# Patient Record
Sex: Male | Born: 1955 | Race: White | Hispanic: No | Smoking: Current every day smoker
Health system: Southern US, Community
[De-identification: ages and names within clinical notes are randomized; demographics above are authoritative.]

## PROBLEM LIST (undated history)

## (undated) DIAGNOSIS — D696 Thrombocytopenia, unspecified: Secondary | ICD-10-CM

## (undated) DIAGNOSIS — F0390 Unspecified dementia without behavioral disturbance: Secondary | ICD-10-CM

## (undated) DIAGNOSIS — D649 Anemia, unspecified: Secondary | ICD-10-CM

## (undated) DIAGNOSIS — E559 Vitamin D deficiency, unspecified: Secondary | ICD-10-CM

## (undated) DIAGNOSIS — F419 Anxiety disorder, unspecified: Secondary | ICD-10-CM

## (undated) DIAGNOSIS — K769 Liver disease, unspecified: Secondary | ICD-10-CM

## (undated) DIAGNOSIS — E785 Hyperlipidemia, unspecified: Secondary | ICD-10-CM

## (undated) DIAGNOSIS — K7460 Unspecified cirrhosis of liver: Secondary | ICD-10-CM

## (undated) DIAGNOSIS — H269 Unspecified cataract: Secondary | ICD-10-CM

## (undated) DIAGNOSIS — B1920 Unspecified viral hepatitis C without hepatic coma: Secondary | ICD-10-CM

## (undated) HISTORY — DX: Unspecified cataract: H26.9

## (undated) HISTORY — DX: Unspecified viral hepatitis C without hepatic coma: B19.20

---

## 2004-07-20 ENCOUNTER — Emergency Department (HOSPITAL_COMMUNITY): Admission: EM | Admit: 2004-07-20 | Discharge: 2004-07-20 | Payer: Self-pay | Admitting: Emergency Medicine

## 2005-01-14 ENCOUNTER — Emergency Department (HOSPITAL_COMMUNITY): Admission: EM | Admit: 2005-01-14 | Discharge: 2005-01-14 | Payer: Self-pay | Admitting: Emergency Medicine

## 2005-02-03 ENCOUNTER — Emergency Department (HOSPITAL_COMMUNITY): Admission: EM | Admit: 2005-02-03 | Discharge: 2005-02-03 | Payer: Self-pay | Admitting: *Deleted

## 2016-04-14 HISTORY — PX: HIP FRACTURE SURGERY: SHX118

## 2016-05-07 DIAGNOSIS — S7292XA Unspecified fracture of left femur, initial encounter for closed fracture: Secondary | ICD-10-CM | POA: Insufficient documentation

## 2016-05-16 ENCOUNTER — Encounter (HOSPITAL_COMMUNITY)
Admission: RE | Admit: 2016-05-16 | Discharge: 2016-05-16 | Disposition: A | Discharge status: Home / Self Care | Payer: Medicare Other | Source: Skilled Nursing Facility | Attending: Internal Medicine | Admitting: Internal Medicine

## 2016-05-16 LAB — BASIC METABOLIC PANEL
Anion gap: 5 (ref 5–15)
BUN: 10 mg/dL (ref 6–20)
CHLORIDE: 103 mmol/L (ref 101–111)
CO2: 25 mmol/L (ref 22–32)
Calcium: 8.4 mg/dL — ABNORMAL LOW (ref 8.9–10.3)
Creatinine, Ser: 0.43 mg/dL — ABNORMAL LOW (ref 0.61–1.24)
GFR calc Af Amer: >60 mL/min (ref >60)
GFR calc non Af Amer: >60 mL/min (ref >60)
Glucose, Bld: 84 mg/dL (ref 65–99)
Potassium: 4 mmol/L (ref 3.5–5.1)
Sodium: 133 mmol/L — ABNORMAL LOW (ref 135–145)

## 2016-05-16 LAB — CBC
HEMATOCRIT: 28.1 % — AB (ref 39.0–52.0)
HEMOGLOBIN: 9.3 g/dL — AB (ref 13.0–17.0)
MCH: 31.7 pg (ref 26.0–34.0)
MCHC: 33.1 g/dL (ref 30.0–36.0)
MCV: 95.9 fL (ref 78.0–100.0)
Platelets: 272 10*3/uL (ref 150–400)
RBC: 2.93 MIL/uL — ABNORMAL LOW (ref 4.22–5.81)
RDW: 20.3 % — ABNORMAL HIGH (ref 11.5–15.5)
WBC: 6.4 10*3/uL (ref 4.0–10.5)

## 2016-05-19 ENCOUNTER — Encounter: Payer: Self-pay | Admitting: Internal Medicine

## 2016-05-19 ENCOUNTER — Non-Acute Institutional Stay (SKILLED_NURSING_FACILITY): Payer: Medicare Other | Admitting: Internal Medicine

## 2016-05-19 DIAGNOSIS — F1010 Alcohol abuse, uncomplicated: Secondary | ICD-10-CM | POA: Diagnosis not present

## 2016-05-19 DIAGNOSIS — B182 Chronic viral hepatitis C: Secondary | ICD-10-CM | POA: Diagnosis not present

## 2016-05-19 DIAGNOSIS — S72002S Fracture of unspecified part of neck of left femur, sequela: Secondary | ICD-10-CM

## 2016-05-19 DIAGNOSIS — Z720 Tobacco use: Secondary | ICD-10-CM

## 2016-05-19 DIAGNOSIS — D649 Anemia, unspecified: Secondary | ICD-10-CM | POA: Insufficient documentation

## 2016-05-19 DIAGNOSIS — F1020 Alcohol dependence, uncomplicated: Secondary | ICD-10-CM | POA: Insufficient documentation

## 2016-05-19 DIAGNOSIS — I471 Supraventricular tachycardia: Secondary | ICD-10-CM | POA: Diagnosis not present

## 2016-05-19 DIAGNOSIS — R790 Abnormal level of blood mineral: Secondary | ICD-10-CM | POA: Insufficient documentation

## 2016-05-19 DIAGNOSIS — D500 Iron deficiency anemia secondary to blood loss (chronic): Secondary | ICD-10-CM | POA: Diagnosis not present

## 2016-05-19 DIAGNOSIS — B1920 Unspecified viral hepatitis C without hepatic coma: Secondary | ICD-10-CM | POA: Insufficient documentation

## 2016-05-19 NOTE — Progress Notes (Signed)
Provider:  Veleta Miners Location:   [place] Room Number: 128/P Place of Service:  SNF (31)  PCP: No primary care provider on file. No care team member to display  Extended Emergency Contact Information Primary Emergency Contact: [place] Phone: [phone] Relation: None  Code Status: DNR Goals of Care: Advanced Directive information Advanced Directives 05/19/2016  Does patient have an advance directive? Yes  Type of Advance Directive Out of facility DNR (pink MOST or yellow form)  Does patient want to make changes to advanced directive? No - Patient declined  Copy of advanced directive(s) in chart? Yes      Chief Complaint  Patient presents with  . New Admit To SNF    HPI: Patient is a 60 y.o. male seen today for admission to SNF for rehab. [place] He had Percutaneous Fixation on 08/23.Marland KitchenAfter surgery he went into Hemorrhagic shock and had SVT.He is admitted to SNF for rehab. Right now beside pain in surgical site he is doing well. History reviewed  He has History Of Hepatitis C Also History of Previous Femur fracture when he was in High school   reports that he has been smoking Cigarettes.  He has a 45.00 pack-year smoking history. He has never used smokeless tobacco. He reports that he drinks about 3.6 oz of alcohol per week . He reports that he does not use drugs. Social History   Social History  . Marital status: Divorced    Spouse name: N/A  . Number of children: N/A  . Years of education: N/A   Occupational History  . Not on file.   Social History Main Topics  . Smoking status: Current Every Day Smoker    Packs/day: 1.50    Years: 30.00    Types: Cigarettes  . Smokeless tobacco: Never Used  . Alcohol use 3.6 oz/week    6 Cans of beer per week  . Drug use: No  . Sexual activity: Not on file   Other Topics Concern  . Not on file   Social History  Narrative  . No narrative on file    Functional Status Survey:    Family History  Problem Relation Age of Onset  . Cancer Mother   . Heart disease Father       No Known Allergies    Medication List       Accurate as of 05/19/16  5:05 PM. Always use your most recent med list.          acetaminophen 500 MG tablet Commonly known as:  TYLENOL Take 1,000 mg by mouth every 8 (eight) hours as needed.   ascorbic acid 500 MG tablet Commonly known as:  VITAMIN C Take 500 mg by mouth 2 (two) times daily.   calcium citrate 950 MG tablet Commonly known as:  CALCITRATE - dosed in mg elemental calcium Take 200 mg of elemental calcium by mouth daily.   Cholecalciferol 10000 units Caps Give 1 capsule by mouth daily   enoxaparin 40 MG/0.4ML injection Commonly known as:  LOVENOX Inject 40 mg into the skin daily. Last dose on 06/22/16   ENSURE Take 237 mLs by mouth 3 (three) times daily between meals.   magnesium chloride 64 MG Tbec SR tablet Commonly known as:  SLOW-MAG Take 2 tablets by mouth 2 (two) times daily. STOP DATE 06/13/16   METOPROLOL TARTRATE PO Take 12.5 mg by mouth 2 (two) times daily.  multivitamin tablet Take 1 tablet by mouth daily.   oxycodone 5 MG capsule Commonly known as:  OXY-IR Take 10 mg by mouth every 4 (four) hours as needed for pain. Severe pain   oxycodone 5 MG capsule Commonly known as:  OXY-IR Take 5 mg by mouth every 4 (four) hours as needed for pain. For moderate pain   senna-docusate 8.6-50 MG tablet Commonly known as:  Senokot-S Take 2 tablets by mouth daily. Hold for diarrhea       Review of Systems  Constitutional: Negative.   Respiratory: Negative.   Cardiovascular: Negative.   Gastrointestinal: Negative.   Genitourinary: Negative.     Vitals:   05/19/16 1119  BP: 104/70  Pulse: 88  Resp: (!) 22  Temp: 99 F (37.2 C)  TempSrc: Oral  Weight: 119 lb 9.6 oz (54.3 kg)  Height: 5\' 6"  (1.676 m)   Body mass index is  19.3 kg/m. Physical Exam  Constitutional: He is oriented to person, place, and time. He appears well-developed.  HENT:  Head: Normocephalic.  Eyes: Pupils are equal, round, and reactive to light.  Neck: Neck supple.  Cardiovascular: Normal rate and regular rhythm.   Pulmonary/Chest: Breath sounds normal. No respiratory distress. He has no wheezes. He exhibits no tenderness.  Abdominal: Bowel sounds are normal. There is no tenderness. There is no rebound and no guarding.  Musculoskeletal: Normal range of motion. He exhibits no edema.  Neurological: He is alert and oriented to person, place, and time.    Labs reviewed: Basic Metabolic Panel:  Recent Labs  05/16/16 0500  NA 133*  K 4.0  CL 103  CO2 25  GLUCOSE 84  BUN 10  CREATININE 0.43*  CALCIUM 8.4*   Liver Function Tests:    CBC:  Cardiac Enzymes:  BNP:   Imaging and Procedures obtained prior to SNF admission: No results found.  Assessment/Plan  Hip fracture, left,  Patient Doing well. Contiue Pt. Follow up with Ortho in [place].  Alcohol abuse  Patient went through Alcohol withdrawal in hospital. Stable. Not interested in quitting right now. Chronic hepatitis C  Do not know the status. Need to have gastroenterology appointment on discharge for treatment.  Paroxysmal supraventricular tachycardia (HCC)  It was most likely due to Hemorrhage shock and alcohol withdrawal. Continue Lopressor.  Iron deficiency anemia due to chronic blood loss  Will start him on Iron Last HGB was 9.3 which is improving from 8. Will continue to monitor. He does need Colonoscopy screening as had never done.  Low serum magnesium level Continue Magnesium Supplement. Last Magnesium was 1.5  Tobacco abuse Patient did not want to talk today about cessation.      Family/ staff Communication:   Labs/tests ordered:

## 2016-05-20 ENCOUNTER — Other Ambulatory Visit: Payer: Self-pay | Admitting: *Deleted

## 2016-05-20 MED ORDER — OXYCODONE HCL 5 MG PO CAPS: ORAL_CAPSULE | ORAL | 0 refills | Status: DC

## 2016-05-20 NOTE — Telephone Encounter (Signed)
[facility] #[phone] Fax: [phone]   

## 2016-05-24 ENCOUNTER — Non-Acute Institutional Stay (SKILLED_NURSING_FACILITY): Payer: Medicare Other | Admitting: Internal Medicine

## 2016-05-24 DIAGNOSIS — D500 Iron deficiency anemia secondary to blood loss (chronic): Secondary | ICD-10-CM | POA: Diagnosis not present

## 2016-05-24 DIAGNOSIS — L539 Erythematous condition, unspecified: Secondary | ICD-10-CM

## 2016-05-24 DIAGNOSIS — I471 Supraventricular tachycardia: Secondary | ICD-10-CM | POA: Diagnosis not present

## 2016-05-24 NOTE — Progress Notes (Signed)
This is an acute visit.  Level care skilled.  Facility [facility].  Chief complaint acute visit follow-up surgical site evaluation.  History of present illness.  Patient is a pleasant 60 year old male who is here for rehabilitation after sustaining a proximal femur fracture after an accident [place] water sports.  He had a percutaneous fixation on August 23 after surgery went into hemorrhagic shock and had SVT.  He's been admitted here for rehabilitation and his course has been fairly unremarkable.  He is not complaining of any palpitations shortness breath or increased weakness beyond baseline.  He still has his staples in and nursing staff has asked me to look at the surgical site apparently he has some mild erythema around the area.  He does report some tenderness to the area but he says this is not new he has been afebrile vital signs appear to be stable pulses lately appear to be in the 70 to 80s range blood pressure appears stable as well he does not complain of any chest pain  Previous medical history.  History of closed fracture of left femur status post repair.  Alcohol dependence.  Hepatitis C.  Tobacco abuse.  Postoperative hemorrhagic shock.  Hypomagnesemia   Social History  . Marital status: Divorced    Spouse name: N/A  . Number of children: N/A  . Years of education: N/A      Occupational History  . Not on file.        Social History Main Topics  . Smoking status: Current Every Day Smoker    Packs/day: 1.50    Years: 30.00    Types: Cigarettes  . Smokeless tobacco: Never Used  . Alcohol use 3.6 oz/week    6 Cans of beer per week  . Drug use: No  . Sexual activity: Not on file       Other Topics Concern  . Not on file      Social History Narrative  . No narrative on file    Functional Status Survey:         Family History  Problem Relation Age of Onset  . Cancer Mother   . Heart disease Father        No Known Allergies        Medication List               acetaminophen 500 MG tablet Commonly known as:  TYLENOL Take 1,000 mg by mouth every 8 (eight) hours as needed.  ascorbic acid 500 MG tablet Commonly known as:  VITAMIN C Take 500 mg by mouth 2 (two) times daily.  calcium citrate 950 MG tablet Commonly known as:  CALCITRATE - dosed in mg elemental calcium Take 200 mg of elemental calcium by mouth daily.  Cholecalciferol 10000 units Caps Give 1 capsule by mouth daily  enoxaparin 40 MG/0.4ML injection Commonly known as:  LOVENOX Inject 40 mg into the skin daily. Last dose on 06/22/16  ENSURE Take 237 mLs by mouth 3 (three) times daily between meals.  magnesium chloride 64 MG Tbec SR tablet Commonly known as:  SLOW-MAG Take 2 tablets by mouth 2 (two) times daily. STOP DATE 06/13/16  METOPROLOL TARTRATE PO Take 12.5 mg by mouth 2 (two) times daily.  multivitamin tablet Take 1 tablet by mouth daily.  oxycodone 5 MG capsule Commonly known as:  OXY-IR Take 10 mg by mouth every 4 (four) hours as needed for pain. Severe pain  oxycodone 5 MG capsule Commonly known as:  OXY-IR Take 5 mg by mouth every 4 (four) hours as needed for pain. For moderate pain  senna-docusate 8.6-50 MG tablet Commonly known as:  Senokot-S Take 2 tablets by mouth daily. Hold for diarrhea      Review of Systems  Constitutional: Negative.  No complaints of fever chills Respiratory: Negative.   Cardiovascular: Negative. No chest pain or palpitations or significant edema   Gastrointestinal: Negative.   Genitourinary: Negative.   Skin-skin issues as noted above   Temperature 98.7 pulse 73 respirations 20 blood pressure 100/67 Body mass index is 19.3 kg/m. Physical Exam  Constitutional: He is oriented to person, place, and time. He appears well-developed.  HENT:  Head: Normocephalic.  Eyes: Pupils are equal, round, and reactive to light.  Neck: Neck supple.   Cardiovascular: Normal rate and regular rhythm.   Pulmonary/Chest: Breath sounds normal. No respiratory distress. He has no wheezes. He exhibits no tenderness.  Abdominal: Bowel sounds are normal. There is no tenderness. There is no rebound and no guarding.  Musculoskeletal: Normal range of motion. He exhibits no edema.Pedal pulses intact.  Left lateral surgical site Staples are in place there is some mild erythema extending around the mid incision area bilaterally there is some tenderness to palpation patient states tenderness has been there since the surgery I do not see any drainage or bleeding-staples left knee area appears to be intact I do not see any erythema drainage or bleeding  Neurological: He is alert and oriented to person, place, and time.    Labs reviewed:  05/16/2016 WBC 6.4 hemoglobin 9.3 platelets [id number] Basic Metabolic Panel:  Recent Labs (within last 365 days)   Recent Labs  05/16/16 0500  NA 133*  K 4.0  CL 103  CO2 25  GLUCOSE 84  BUN 10  CREATININE 0.43*  CALCIUM 8.4*     Liver Function Tests:    CBC:  Cardiac Enzymes:  BNP:   Imaging and Procedures obtained prior to SNF admission: No results found.  Assessment/Plan  Hip fracture, left,--with some erythema  He is followed by orthopedics in [place] will have them contact about their desires for staple removal-he does have some mild erythema around the site will start him empirically on doxycycline 100 mg twice a day for 7 days and monitor also probiotic twice a day for 7 days--also notify orthopedics tomorrow a.m. about these changes in their recommendations  Alcohol abuse  Patient went through Alcohol withdrawal in hospital. Stable. Not interested in quitting right now. Chronic hepatitis C  Do not know the status. Need to have gastroenterology appointment on discharge for treatment.  Paroxysmal supraventricular tachycardia (HCC)  It was most likely due to  Hemorrhage shock and alcohol withdrawal. Continue Lopressor.As noted above pulse rate appears controlled  Iron deficiency anemia due to chronic blood loss   Last hemoglobin was 9.3 on 11/14/2015 he has been started on iron. He does need Colonoscopy screening as had never done. Will update a CBC next lab day  [phone]

## 2016-05-25 ENCOUNTER — Other Ambulatory Visit (HOSPITAL_COMMUNITY)
Admission: RE | Admit: 2016-05-25 | Discharge: 2016-05-25 | Disposition: A | Discharge status: Home / Self Care | Payer: Medicare Other | Source: Skilled Nursing Facility | Attending: Internal Medicine | Admitting: Internal Medicine

## 2016-05-25 DIAGNOSIS — X58XXXA Exposure to other specified factors, initial encounter: Secondary | ICD-10-CM | POA: Diagnosis not present

## 2016-05-25 DIAGNOSIS — S72002A Fracture of unspecified part of neck of left femur, initial encounter for closed fracture: Secondary | ICD-10-CM | POA: Diagnosis present

## 2016-05-25 LAB — CBC
HCT: 34.4 % — ABNORMAL LOW (ref 39.0–52.0)
Hemoglobin: 11.2 g/dL — ABNORMAL LOW (ref 13.0–17.0)
MCH: 32.1 pg (ref 26.0–34.0)
MCHC: 32.6 g/dL (ref 30.0–36.0)
MCV: 98.6 fL (ref 78.0–100.0)
Platelets: 288 10*3/uL (ref 150–400)
RBC: 3.49 MIL/uL — AB (ref 4.22–5.81)
RDW: 18.4 % — ABNORMAL HIGH (ref 11.5–15.5)
WBC: 4.5 10*3/uL (ref 4.0–10.5)

## 2016-05-27 ENCOUNTER — Inpatient Hospital Stay
Admission: RE | Admit: 2016-05-27 | Discharge: 2016-06-25 | Disposition: A | Discharge status: Home / Self Care | Payer: Medicare Other | Source: Ambulatory Visit | Attending: Internal Medicine | Admitting: Internal Medicine

## 2016-06-01 DIAGNOSIS — Z9181 History of falling: Secondary | ICD-10-CM | POA: Insufficient documentation

## 2016-06-01 DIAGNOSIS — E559 Vitamin D deficiency, unspecified: Secondary | ICD-10-CM | POA: Insufficient documentation

## 2016-06-01 DIAGNOSIS — F17200 Nicotine dependence, unspecified, uncomplicated: Secondary | ICD-10-CM | POA: Insufficient documentation

## 2016-06-01 DIAGNOSIS — M810 Age-related osteoporosis without current pathological fracture: Secondary | ICD-10-CM | POA: Insufficient documentation

## 2016-06-05 ENCOUNTER — Encounter: Payer: Self-pay | Admitting: Internal Medicine

## 2016-06-22 ENCOUNTER — Non-Acute Institutional Stay (SKILLED_NURSING_FACILITY): Payer: Medicare Other | Admitting: Internal Medicine

## 2016-06-22 ENCOUNTER — Encounter: Payer: Self-pay | Admitting: Internal Medicine

## 2016-06-22 DIAGNOSIS — I471 Supraventricular tachycardia: Secondary | ICD-10-CM

## 2016-06-22 DIAGNOSIS — D62 Acute posthemorrhagic anemia: Secondary | ICD-10-CM | POA: Diagnosis not present

## 2016-06-22 DIAGNOSIS — S72002D Fracture of unspecified part of neck of left femur, subsequent encounter for closed fracture with routine healing: Secondary | ICD-10-CM

## 2016-06-22 NOTE — Progress Notes (Signed)
Location:   [place] Room Number: 128/P Place of Service:  SNF (31)  Provider: Granville Lewis  PCP: Pcp Not In System Patient Care Team: Pcp Not In System as PCP - General  Extended Emergency Contact Information Primary Emergency Contact: [place] Phone: [phone] Relation: None  Code Status: DNR Goals of care:  Advanced Directive information Advanced Directives 06/22/2016  Does patient have an advance directive? Yes  Type of Advance Directive Out of facility DNR (pink MOST or yellow form)  Does patient want to make changes to advanced directive? No - Patient declined  Copy of advanced directive(s) in chart? Yes     No Known Allergies  Chief Complaint  Patient presents with  . Discharge Note    HPI:  60 y.o. male  who is being discharged from the facility later this week.  He had a proximal femur fracture after her next in the [facility] plain water sports-he had a percutaneous fixation on 05/06/2016 postop he did go into hemorrhagic shock and had SVT.  He has been admitted to skilled nursing for rehabilitation and has done quite well-vital signs are stable he has no complaints today is looking forward to going home he does live alone-he will need PT and OT for further strengthening as well as nursing support to follow his medical issues.  Of note he does have a significant tobacco history but has not smoked apparently since his hospitalization and says he hopes to quit permanently.  He also has drunk apparently about 3.6 ounces of alcohol per week he has not drank alcohol during this hospitalization or his stay here he says he would hope the stay off alcohol as well.  He does not report any illicit drug use.   Past Medical History:  Diagnosis Date  . Hepatitis C     History reviewed. No pertinent surgical history.    reports that he has been smoking Cigarettes.  He has a 45.00 pack-year smoking history. He has never used smokeless  tobacco. He reports that he drinks about 3.6 oz of alcohol per week . He reports that he does not use drugs. Social History   Social History  . Marital status: Divorced    Spouse name: N/A  . Number of children: N/A  . Years of education: N/A   Occupational History  . Not on file.   Social History Main Topics  . Smoking status: Current Every Day Smoker    Packs/day: 1.50    Years: 30.00    Types: Cigarettes  . Smokeless tobacco: Never Used  . Alcohol use 3.6 oz/week    6 Cans of beer per week  . Drug use: No  . Sexual activity: Not on file   Other Topics Concern  . Not on file   Social History Narrative  . No narrative on file   Functional Status Survey:    No Known Allergies  Pertinent  Health Maintenance Due  Topic Date Due  . COLONOSCOPY  07/09/2007  . INFLUENZA VACCINE  08/14/2016 (Originally 04/14/2016)    Medications: Current Outpatient Prescriptions on File Prior to Visit  Medication Sig Dispense Refill  . acetaminophen (TYLENOL) 500 MG tablet Take 1,000 mg by mouth every 8 (eight) hours as needed.    Marland Kitchen ascorbic acid (VITAMIN C) 500 MG tablet Take 500 mg by mouth 2 (two) times daily.    . calcium citrate (CALCITRATE - DOSED IN MG ELEMENTAL CALCIUM) 950 MG tablet Take 200 mg of elemental calcium  by mouth daily.    . Cholecalciferol 10000 units CAPS Give 1 capsule by mouth daily    . ENSURE (ENSURE) Take 237 mLs by mouth 3 (three) times daily between meals.    Marland Kitchen METOPROLOL TARTRATE PO Take 12.5 mg by mouth 2 (two) times daily.    . Multiple Vitamin (MULTIVITAMIN) tablet Take 1 tablet by mouth daily.    Marland Kitchen oxycodone (OXY-IR) 5 MG capsule Take 5 mg by mouth every 4 (four) hours as needed for pain. For moderate pain    . senna-docusate (SENOKOT-S) 8.6-50 MG tablet Take 2 tablets by mouth 2 (two) times daily. Hold for diarrhea      No current facility-administered medications on file prior to visit.      Review of Systems   General does not complaining fever  chills  \Skin does not quite a rashes or itching surgical site left hip appears benign earlier stay had been concern with some erythema but this appears to have resolved.  Head ears eyes nose mouth and throat does not complain of visual changes or sore throat.  Respiratory no shortness breath or cough.  Cardiac does not complain of chest pain does not have significant lower extremity edema.  GI is not complaining of abdominal pain nausea vomiting diarrhea or constipation does have a history of hepatitis.  GU does not complaining of dysuria.  Muscle skeletal is not complaining currently of joint pain apparently hip pain is controlled with the OxyIR which it appears she takes about once a day  Neurologic is not complaining of dizziness headache or syncope.  Psych does not complain of anxiety or depression does have a history of alcohol or tobacco use but says he has not used either here in several weeks and would like to continue abstinence   Vitals:   06/22/16 1220  BP: 107/65  Pulse: 67  Resp: 18  Temp: 97.2 F (36.2 C)  TempSrc: Oral   There is no height or weight on file to calculate BMI. Physical Exam   In general this is a pleasant middle-aged male in no distress resting comfortably in bed.  His skin is warm and dry surgical site has a well-healed surgical scar there is no surrounding erythema or warmth there is some mild tenderness to palpation of the area he says his baseline.  Eyes pupils appear reactive light sclera and conjunctiva are clear visual acuity appears intact   oropharynx is clear mucous membranes moist  Chest is clear to auscultation there is no labored breathing.  Heart is regular rate and rhythm without murmur gallop or rub he does not have significant lower extremity edema pedal pulses are intact.  Abdomen soft nontender positive bowel sounds.  Muscle skeletal is able to move all extremities 4 is able to ambulate with a walker and appears to be  doing well with this pill a bit weak.  Upper extremity strength appears preserved I do not see any deformities.  Neurologic is grossly intact to speech is clear no R findings.  Psych he is alert and oriented pleasant and appropriate  Labs reviewed: Basic Metabolic Panel:  Recent Labs  05/16/16 0500  NA 133*  K 4.0  CL 103  CO2 25  GLUCOSE 84  BUN 10  CREATININE 0.43*  CALCIUM 8.4*   Liver Function Tests: No results for input(s): AST, ALT, ALKPHOS, BILITOT, PROT, ALBUMIN in the last 8760 hours. No results for input(s): LIPASE, AMYLASE in the last 8760 hours. No results for input(s): AMMONIA  in the last 8760 hours. CBC:  Recent Labs  05/16/16 0500 05/25/16 0650  WBC 6.4 4.5  HGB 9.3* 11.2*  HCT 28.1* 34.4*  MCV 95.9 98.6  PLT 272 288   Cardiac Enzymes: No results for input(s): CKTOTAL, CKMB, CKMBINDEX, TROPONINI in the last 8760 hours. BNP: Invalid input(s): POCBNP CBG: No results for input(s): GLUCAP in the last 8760 hours.  Procedures and Imaging Studies During Stay: No results found.  Assessment/Plan:   #1 history of left hip fracture-he appears to be doing well with his rehabilitation would benefit from continued PT and OT when he goes home he did see orthopedics on 06/18/2016 and Lovenox was discontinued for DVT prophylaxis-she is now toe-touch weightbearing on the left lower extremity.  He is requiring OxyIR about 1 time a day.  Currently has no complaints-.  #2 history of anemia most likely postop he has been started on iron and hemoglobin is rising most recently 11.2 on lab done on September 11 previously had been 9.3-and it had been as low as 8-will defer follow-up to primary care provider he is doing well in this regard.  #3 history of supraventricular tachycardia-it was thought likely due to hemorrhagic shock and alcohol withdrawal-this has not been an issue here he is on Lopressor heart rate appears to be controlled largely it appears in the 60-70  range.  #4 history of alcohol abuse apparently he did have alcohol withdrawal the hospital this has not been an issue during stay again as noted he says he will try to stay off alcohol once he is discharged feels he is motivated now.  #5 history of chronic hepatitis C we have suggested GI follow-up he says he is not interested in having this arranged upon discharge-he will see his primary care provider.  #6 history of low magnesium level this has been supplemented last magnesium was 1.5 Will update level.  #7 history of tobacco abuse-as noted above patient is not smoking several weeks says he does not have a desire to smoke again although I suspect this may be somewhat of a challenge once he goes home but he does understand the value of smoking sensation and he was encouraged to continue this      Patient is being discharged with the following home health services: PT and OT for strengthening as well as nursing to follow up his medical issues   :    Patient has been advised to f/u with their PCP in 1-2 weeks to bring them up to date on their rehab stay.  Social services at facility was responsible for arranging this appointment.  Pt was provided with a 30 day supply of prescriptions for medications and refills must be obtained from their PCP.  For controlled substances, a more limited supply may be provided adequate until PCP appointment only.  Future labs/tests needed:  Will update magnesium level as well as CBC and metabolic panel.  [id number] note greater than 30 minutes spent on this discharge summary-greater than 50% of time spent coordinating plan of care for numerous diagnoses

## 2016-06-23 ENCOUNTER — Encounter (HOSPITAL_COMMUNITY)
Admission: RE | Admit: 2016-06-23 | Discharge: 2016-06-23 | Disposition: A | Discharge status: Home / Self Care | Payer: Medicare Other | Source: Skilled Nursing Facility | Attending: Internal Medicine | Admitting: Internal Medicine

## 2016-06-23 LAB — CBC
HCT: 41.2 % (ref 39.0–52.0)
Hemoglobin: 14.2 g/dL (ref 13.0–17.0)
MCH: 33.6 pg (ref 26.0–34.0)
MCHC: 34.5 g/dL (ref 30.0–36.0)
MCV: 97.6 fL (ref 78.0–100.0)
Platelets: 213 10*3/uL (ref 150–400)
RBC: 4.22 MIL/uL (ref 4.22–5.81)
RDW: 13.7 % (ref 11.5–15.5)
WBC: 5 10*3/uL (ref 4.0–10.5)

## 2016-06-23 LAB — BASIC METABOLIC PANEL
Anion gap: 4 — ABNORMAL LOW (ref 5–15)
BUN: 8 mg/dL (ref 6–20)
CALCIUM: 8.9 mg/dL (ref 8.9–10.3)
CO2: 28 mmol/L (ref 22–32)
Chloride: 101 mmol/L (ref 101–111)
Creatinine, Ser: 0.52 mg/dL — ABNORMAL LOW (ref 0.61–1.24)
GFR calc Af Amer: >60 mL/min (ref >60)
GFR calc non Af Amer: >60 mL/min (ref >60)
Glucose, Bld: 76 mg/dL (ref 65–99)
Potassium: 3.4 mmol/L — ABNORMAL LOW (ref 3.5–5.1)
Sodium: 133 mmol/L — ABNORMAL LOW (ref 135–145)

## 2016-06-23 LAB — MAGNESIUM: Magnesium: 1.2 mg/dL — ABNORMAL LOW (ref 1.7–2.4)

## 2016-06-30 ENCOUNTER — Encounter (HOSPITAL_COMMUNITY)
Admission: RE | Admit: 2016-06-30 | Discharge: 2016-06-30 | Disposition: A | Discharge status: Home / Self Care | Payer: Medicare Other | Source: Skilled Nursing Facility | Attending: *Deleted | Admitting: *Deleted

## 2016-07-15 ENCOUNTER — Encounter: Payer: Self-pay | Admitting: Family Medicine

## 2016-07-15 ENCOUNTER — Ambulatory Visit (INDEPENDENT_AMBULATORY_CARE_PROVIDER_SITE_OTHER): Payer: Medicare Other | Admitting: Family Medicine

## 2016-07-15 VITALS — BP 131/75 | HR 68 | Temp 96.9°F | Ht 66.0 in | Wt 121.0 lb

## 2016-07-15 DIAGNOSIS — I471 Supraventricular tachycardia: Secondary | ICD-10-CM

## 2016-07-15 DIAGNOSIS — D508 Other iron deficiency anemias: Secondary | ICD-10-CM | POA: Diagnosis not present

## 2016-07-15 MED ORDER — METOPROLOL TARTRATE 25 MG PO TABS: 12.5000 mg | ORAL_TABLET | Freq: Two times a day (BID) | ORAL | 1 refills | Status: DC

## 2016-07-15 NOTE — Progress Notes (Signed)
Subjective:    Patient ID: Kyle Hammond, male    DOB: Apr 10, 1956, 59 y.o.   MRN: [id number]  HPI first visit for this 60 year old male who suffered a femur fracture in late August. This was repaired and he was discharged to skilled nursing for rehabilitation. There've been issues with alcohol use and abuse over time as he is known by some folks working in this office. He lives by himself but he has a sick brother and sister-in-law who will manage his medicines. He is on several vitamins. He is off pain pills now. He takes metoprolol twice a day for hypertension and supraventricular tachycardia. There is a history of hepatitis C. He complains today of irregular bowel habits and. Reviewing his medicines he is on several laxatives as well as arm which could be affecting his bowel movements.  Patient Active Problem List   Diagnosis Date Noted  . Alcohol abuse 05/19/2016  . Hepatitis C 05/19/2016  . Paroxysmal supraventricular tachycardia ([place]) 05/19/2016  . Anemia 05/19/2016  . Low serum magnesium level 05/19/2016   Outpatient Encounter Prescriptions as of 07/15/2016  Medication Sig  . acetaminophen (TYLENOL) 500 MG tablet Take 1,000 mg by mouth every 8 (eight) hours as needed.  Marland Kitchen ascorbic acid (VITAMIN C) 500 MG tablet Take 500 mg by mouth 2 (two) times daily.  Marland Kitchen aspirin EC 81 MG tablet Take 81 mg by mouth 2 (two) times daily.  . bisacodyl (DULCOLAX) 5 MG EC tablet Take 10 mg by mouth daily as needed for moderate constipation.  . calcium citrate (CALCITRATE - DOSED IN MG ELEMENTAL CALCIUM) 950 MG tablet Take 200 mg of elemental calcium by mouth daily.  . Cholecalciferol 10000 units CAPS Give 1 capsule by mouth daily  . ENSURE (ENSURE) Take 237 mLs by mouth 3 (three) times daily between meals.  . ferrous sulfate (KP FERROUS SULFATE) 325 (65 FE) MG tablet Take 325 mg by mouth 2 (two) times daily with a meal.  . Magnesium 400 MG TABS Take 1 tablet by mouth daily.  . metoprolol tartrate  (LOPRESSOR) 25 MG tablet Take 12.5 tablets by mouth 2 (two) times daily.  . Multiple Vitamin (MULTIVITAMIN) tablet Take 1 tablet by mouth daily.  Marland Kitchen senna-docusate (SENOKOT-S) 8.6-50 MG tablet Take 2 tablets by mouth 2 (two) times daily. Hold for diarrhea   . Vitamin D, Ergocalciferol, (DRISDOL) 50000 units CAPS capsule Give 50,000 unit capsule weekly X 8 weeks once a day on wed. Stop date 07/22/2016  . [DISCONTINUED] METOPROLOL TARTRATE PO Take 12.5 mg by mouth 2 (two) times daily.  . [DISCONTINUED] oxycodone (OXY-IR) 5 MG capsule Take 5 mg by mouth every 4 (four) hours as needed for pain. For moderate pain  . [DISCONTINUED] oxycodone (OXY-IR) 5 MG capsule Give 2 tablets by mouth as needed for severe pain. Every 4 hours   No facility-administered encounter medications on file as of 07/15/2016.       Review of Systems  Constitutional: Negative.   HENT: Negative.   Respiratory: Negative.   Cardiovascular: Negative.   Gastrointestinal: Positive for constipation and rectal pain.  Musculoskeletal: Negative.   Neurological: Negative.   Psychiatric/Behavioral: Negative.        Objective:   Physical Exam  Constitutional: He is oriented to person, place, and time. He appears well-developed.  Patient is slight stature walks with a cane  HENT:  Mouth/Throat: Oropharynx is clear and moist.  Eyes: Pupils are equal, round, and reactive to light.  Cardiovascular: Normal rate, regular rhythm  and normal heart sounds.   Pulmonary/Chest: Effort normal and breath sounds normal.  Abdominal: Soft. Bowel sounds are normal. There is no tenderness.  Genitourinary: Rectum normal.  Neurological: He is alert and oriented to person, place, and time.  Psychiatric: He has a normal mood and affect. His behavior is normal.   BP 131/75   Pulse 68   Temp (!) 96.9 F (36.1 C) (Oral)   Ht '5\' 6"'  (1.676 m)   Wt 121 lb (54.9 kg)   BMI 19.53 kg/m         Assessment & Plan:  1. Other iron deficiency  anemia We'll check CBC today. I've asked him to hold aren't in view of his intestinal complaints. I've asked him to discontinue most of the vitamins but just take a single multivitamin continue with metoprolol. Add MiraLAX to regulate either loose stools or constipation - CBC with Differential/Platelet - BMP8+EGFR - VITAMIN D 25 Hydroxy (Vit-D Deficiency, Fractures)  2. Paroxysmal supraventricular tachycardia (HCC) Into new with metoprolol. Blood pressure is within normal limits. Wardell Honour MD

## 2016-07-16 LAB — BMP8+EGFR
BUN/Creatinine Ratio: 8 — ABNORMAL LOW (ref 9–20)
BUN: 5 mg/dL — AB (ref 6–24)
CALCIUM: 9.1 mg/dL (ref 8.7–10.2)
CO2: 25 mmol/L (ref 18–29)
CREATININE: 0.62 mg/dL — AB (ref 0.76–1.27)
Chloride: 99 mmol/L (ref 96–106)
GFR calc Af Amer: 126 mL/min/{1.73_m2} (ref >59)
GFR calc non Af Amer: 109 mL/min/{1.73_m2} (ref >59)
GLUCOSE: 68 mg/dL (ref 65–99)
Potassium: 4.3 mmol/L (ref 3.5–5.2)
Sodium: 138 mmol/L (ref 134–144)

## 2016-07-16 LAB — CBC WITH DIFFERENTIAL/PLATELET
BASOS ABS: 0 10*3/uL (ref 0.0–0.2)
Basos: 1 %
EOS (ABSOLUTE): 0.1 10*3/uL (ref 0.0–0.4)
Eos: 2 %
HEMOGLOBIN: 15 g/dL (ref 12.6–17.7)
Hematocrit: 43.3 % (ref 37.5–51.0)
IMMATURE GRANS (ABS): 0 10*3/uL (ref 0.0–0.1)
IMMATURE GRANULOCYTES: 0 %
LYMPHS: 46 %
Lymphocytes Absolute: 3 10*3/uL (ref 0.7–3.1)
MCH: 32.9 pg (ref 26.6–33.0)
MCHC: 34.6 g/dL (ref 31.5–35.7)
MCV: 95 fL (ref 79–97)
MONOCYTES: 7 %
Monocytes Absolute: 0.5 10*3/uL (ref 0.1–0.9)
NEUTROS ABS: 2.9 10*3/uL (ref 1.4–7.0)
Neutrophils: 44 %
Platelets: 209 10*3/uL (ref 150–379)
RBC: 4.56 x10E6/uL (ref 4.14–5.80)
RDW: 12.9 % (ref 12.3–15.4)
WBC: 6.5 10*3/uL (ref 3.4–10.8)

## 2016-07-16 LAB — VITAMIN D 25 HYDROXY (VIT D DEFICIENCY, FRACTURES): VIT D 25 HYDROXY: 58.1 ng/mL (ref 30.0–100.0)

## 2016-07-17 ENCOUNTER — Telehealth: Payer: Self-pay | Admitting: Family Medicine

## 2016-07-17 NOTE — Telephone Encounter (Signed)
[facility] says it is there, just to early to fill due to insurance. Pt aware

## 2016-08-20 ENCOUNTER — Ambulatory Visit: Payer: Medicare Other | Admitting: Family Medicine

## 2016-08-31 ENCOUNTER — Telehealth: Payer: Self-pay | Admitting: Family Medicine

## 2016-08-31 DIAGNOSIS — M25552 Pain in left hip: Secondary | ICD-10-CM

## 2016-08-31 NOTE — Telephone Encounter (Signed)
Pt aware referral has been made & Forestine Na will give him a call with the appointment

## 2016-08-31 NOTE — Telephone Encounter (Signed)
Please schedule for physical therapy as requested by patient

## 2018-08-01 ENCOUNTER — Telehealth: Payer: Self-pay

## 2018-08-01 NOTE — Telephone Encounter (Signed)
appt scheduled for pt.  

## 2018-08-01 NOTE — Telephone Encounter (Signed)
Patient calling for advise  Hasn't been her since 2017

## 2018-08-08 ENCOUNTER — Encounter: Payer: Medicare Other | Admitting: Family

## 2019-02-03 ENCOUNTER — Telehealth: Payer: Self-pay | Admitting: Family

## 2019-07-31 ENCOUNTER — Other Ambulatory Visit: Payer: Self-pay | Admitting: Gastroenterology

## 2019-07-31 ENCOUNTER — Other Ambulatory Visit (HOSPITAL_COMMUNITY): Payer: Self-pay | Admitting: Gastroenterology

## 2019-07-31 DIAGNOSIS — Z8619 Personal history of other infectious and parasitic diseases: Secondary | ICD-10-CM

## 2019-08-08 ENCOUNTER — Ambulatory Visit (HOSPITAL_COMMUNITY)
Admission: RE | Admit: 2019-08-08 | Discharge: 2019-08-08 | Disposition: A | Discharge status: Home / Self Care | Payer: Medicare Other | Source: Ambulatory Visit | Attending: Gastroenterology | Admitting: Gastroenterology

## 2019-08-08 ENCOUNTER — Other Ambulatory Visit: Payer: Self-pay

## 2019-08-08 ENCOUNTER — Encounter (HOSPITAL_COMMUNITY): Payer: Self-pay

## 2019-08-08 DIAGNOSIS — Z8619 Personal history of other infectious and parasitic diseases: Secondary | ICD-10-CM

## 2019-08-15 ENCOUNTER — Ambulatory Visit (HOSPITAL_COMMUNITY)
Admission: RE | Admit: 2019-08-15 | Discharge: 2019-08-15 | Disposition: A | Discharge status: Home / Self Care | Payer: Medicare Other | Source: Ambulatory Visit | Attending: Gastroenterology | Admitting: Gastroenterology

## 2019-08-15 ENCOUNTER — Other Ambulatory Visit: Payer: Self-pay

## 2019-08-15 DIAGNOSIS — Z8619 Personal history of other infectious and parasitic diseases: Secondary | ICD-10-CM | POA: Diagnosis not present

## 2019-12-21 ENCOUNTER — Ambulatory Visit: Payer: Medicare Other | Attending: Internal Medicine

## 2019-12-21 DIAGNOSIS — Z23 Encounter for immunization: Secondary | ICD-10-CM

## 2019-12-21 NOTE — Progress Notes (Signed)
   Covid-19 Vaccination Clinic  Name:  Kyle Hammond    MRN: [id number] DOB: 11/22/1955  12/21/2019  [name] was observed post Covid-19 immunization for 15 minutes without incident. He was provided with Vaccine Information Sheet and instruction to access the V-Safe system.   [name] was instructed to call 911 with any severe reactions post vaccine: Marland Kitchen Difficulty breathing  . Swelling of face and throat  . A fast heartbeat  . A bad rash all over body  . Dizziness and weakness   Immunizations Administered    Name Date Dose VIS Date Route   Moderna COVID-19 Vaccine 12/21/2019 12:45 PM 0.5 mL 08/15/2019 Intramuscular   Manufacturer: Moderna   Lot: [id number]   [id number]

## 2020-01-18 ENCOUNTER — Ambulatory Visit: Payer: Medicare Other | Attending: Internal Medicine

## 2020-01-18 DIAGNOSIS — Z23 Encounter for immunization: Secondary | ICD-10-CM

## 2020-01-18 NOTE — Progress Notes (Signed)
   Covid-19 Vaccination Clinic  Name:  Kyle Hammond    MRN: [id number] DOB: 07-11-1956  01/18/2020  Kyle Hammond was observed post Covid-19 immunization for 15 minutes without incident. He was provided with Vaccine Information Sheet and instruction to access the V-Safe system.   Kyle Hammond was instructed to call 911 with any severe reactions post vaccine: Marland Kitchen Difficulty breathing  . Swelling of face and throat  . A fast heartbeat  . A bad rash all over body  . Dizziness and weakness   Immunizations Administered    Name Date Dose VIS Date Route   Moderna COVID-19 Vaccine 01/18/2020 10:27 AM 0.5 mL 08/2019 Intramuscular   Manufacturer: Moderna   Lot: [id number]   [id number]

## 2020-08-29 ENCOUNTER — Other Ambulatory Visit: Payer: Self-pay

## 2020-08-29 ENCOUNTER — Ambulatory Visit: Payer: Medicare Other | Admitting: Orthopaedic Surgery

## 2020-09-18 ENCOUNTER — Other Ambulatory Visit: Payer: Self-pay | Admitting: Gastroenterology

## 2020-09-18 DIAGNOSIS — Z8619 Personal history of other infectious and parasitic diseases: Secondary | ICD-10-CM

## 2020-10-03 ENCOUNTER — Other Ambulatory Visit: Payer: Medicare Other

## 2020-10-15 ENCOUNTER — Ambulatory Visit
Admission: RE | Admit: 2020-10-15 | Discharge: 2020-10-15 | Disposition: A | Discharge status: Home / Self Care | Payer: Medicare Other | Source: Ambulatory Visit | Attending: Gastroenterology | Admitting: Gastroenterology

## 2020-10-15 DIAGNOSIS — Z8619 Personal history of other infectious and parasitic diseases: Secondary | ICD-10-CM

## 2020-11-29 ENCOUNTER — Ambulatory Visit (INDEPENDENT_AMBULATORY_CARE_PROVIDER_SITE_OTHER): Payer: Medicare Other | Admitting: Family Medicine

## 2020-11-29 ENCOUNTER — Other Ambulatory Visit: Payer: Self-pay

## 2020-11-29 ENCOUNTER — Encounter: Payer: Self-pay | Admitting: Family Medicine

## 2020-11-29 VITALS — BP 137/75 | HR 71 | Temp 97.9°F | Ht 66.0 in | Wt 121.0 lb

## 2020-11-29 DIAGNOSIS — B182 Chronic viral hepatitis C: Secondary | ICD-10-CM | POA: Diagnosis not present

## 2020-11-29 DIAGNOSIS — F1020 Alcohol dependence, uncomplicated: Secondary | ICD-10-CM | POA: Diagnosis not present

## 2020-11-29 DIAGNOSIS — F03A0 Unspecified dementia, mild, without behavioral disturbance, psychotic disturbance, mood disturbance, and anxiety: Secondary | ICD-10-CM

## 2020-11-29 DIAGNOSIS — F0390 Unspecified dementia without behavioral disturbance: Secondary | ICD-10-CM | POA: Diagnosis not present

## 2020-11-29 DIAGNOSIS — K4090 Unilateral inguinal hernia, without obstruction or gangrene, not specified as recurrent: Secondary | ICD-10-CM

## 2020-11-29 DIAGNOSIS — M810 Age-related osteoporosis without current pathological fracture: Secondary | ICD-10-CM

## 2020-11-29 DIAGNOSIS — Z7689 Persons encountering health services in other specified circumstances: Secondary | ICD-10-CM

## 2020-11-29 DIAGNOSIS — Z1211 Encounter for screening for malignant neoplasm of colon: Secondary | ICD-10-CM

## 2020-11-29 DIAGNOSIS — Z720 Tobacco use: Secondary | ICD-10-CM | POA: Insufficient documentation

## 2020-11-29 DIAGNOSIS — E559 Vitamin D deficiency, unspecified: Secondary | ICD-10-CM

## 2020-11-29 LAB — CBC WITH DIFFERENTIAL/PLATELET
Basophils Absolute: 0 10*3/uL (ref 0.0–0.2)
Immature Granulocytes: 0 %
Monocytes: 9 %
Neutrophils: 30 %
RBC: 4.43 x10E6/uL (ref 4.14–5.80)

## 2020-11-29 LAB — CMP14+EGFR
ALT: 81 IU/L — ABNORMAL HIGH (ref 0–44)
AST: 113 IU/L — ABNORMAL HIGH (ref 0–40)
Albumin/Globulin Ratio: 1 — ABNORMAL LOW (ref 1.2–2.2)
Albumin: 4.1 g/dL (ref 3.8–4.8)
Alkaline Phosphatase: 77 IU/L (ref 44–121)
BUN/Creatinine Ratio: 14 (ref 10–24)
CO2: 23 mmol/L (ref 20–29)
Total Protein: 8.3 g/dL (ref 6.0–8.5)

## 2020-11-29 NOTE — Progress Notes (Signed)
New Patient Office Visit  Subjective:  Patient ID: Kyle Hammond, male    DOB: Dec 19, 1955  Age: 65 y.o. MRN: [id number]  CC:  Chief Complaint  Patient presents with  . New Patient (Initial Visit)    HPI Kyle Hammond presents to establish care. He reports that he has been doing well. He is not currently taking any medications. He has a history of chronic hepatitis C and reports that he has not been treated for this. He has had a colonoscopy before and reports having 15 polyps removed. He drinks 5 beers a day when he is able to afford to do so. He denies chest pain, shortness of breath, nausea, vomiting, abdominal pain, or fever.   He has noticed a lump in his left groin for about 6 months. It seems like he has gotten a little larger over time. It is not painful. The area is soft. He denies discoloration.    Past Medical History:  Diagnosis Date  . Cataract   . Hepatitis C     Past Surgical History:  Procedure Laterality Date  . CATARACT EXTRACTION, BILATERAL  2019  . HIP FRACTURE SURGERY Left 04/2016    Family History  Problem Relation Age of Onset  . Cancer Mother        bone  . Heart disease Father   . Heart attack Father     Social History   Socioeconomic History  . Marital status: Divorced    Spouse name: Not on file  . Number of children: 2  . Years of education: 39  . Highest education level: High school graduate  Occupational History  . Occupation: disabled  Tobacco Use  . Smoking status: Current Every Day Smoker    Packs/day: 1.00    Years: 50.00    Pack years: 50.00    Types: Cigarettes  . Smokeless tobacco: Never Used  Vaping Use  . Vaping Use: Never used  Substance and Sexual Activity  . Alcohol use: Yes    Alcohol/week: 35.0 standard drinks    Types: 35 Cans of beer per week  . Drug use: No  . Sexual activity: Not Currently  Other Topics Concern  . Not on file  Social History Narrative  . Not on file   Social Determinants of Health    Financial Resource Strain: Not on file  Food Insecurity: Not on file  Transportation Needs: Not on file  Physical Activity: Not on file  Stress: Not on file  Social Connections: Not on file  Intimate Partner Violence: Not on file    ROS Review of Systems As per HPI.  Objective:   Today's Vitals: BP 137/75   Pulse 71   Temp 97.9 F (36.6 C) (Temporal)   Ht _0  (1.676 m)   Wt 121 lb (54.9 kg)   BMI 19.53 kg/m   Physical Exam Vitals and nursing note reviewed.  Constitutional:      General: He is not in acute distress.    Appearance: Normal appearance. He is not ill-appearing.  HENT:     Head: Normocephalic and atraumatic.  Cardiovascular:     Rate and Rhythm: Normal rate and regular rhythm.     Pulses: Normal pulses.     Heart sounds: Normal heart sounds. No murmur heard.   Pulmonary:     Effort: Pulmonary effort is normal. No respiratory distress.     Breath sounds: Normal breath sounds.  Abdominal:     General: Bowel sounds are  normal. There is no distension.     Palpations: Abdomen is soft. There is no mass.     Tenderness: There is no abdominal tenderness. There is no guarding or rebound. Negative signs include Murphy's sign and McBurney's sign.     Hernia: A hernia is present. Hernia is present in the left inguinal area (reducible, no discoloration).  Musculoskeletal:     Cervical back: Neck supple. No rigidity or tenderness.     Right lower leg: No edema.     Left lower leg: No edema.  Skin:    General: Skin is warm and dry.     Coloration: Skin is not jaundiced.     Findings: No rash.  Neurological:     General: No focal deficit present.     Mental Status: He is alert and oriented to person, place, and time.  Psychiatric:        Mood and Affect: Mood normal.        Behavior: Behavior normal.        Thought Content: Thought content normal.        Judgment: Judgment normal.     Assessment & Plan:   Kyle Hammond was seen today for new patient (initial  visit).  Diagnoses and all orders for this visit:  Chronic hepatitis C without hepatic coma ([place]) Labs pending as below. Referral placed to GI.  -     Ambulatory referral to Gastroenterology -     CBC with Differential/Platelet -     HRV44+QPEA  Uncomplicated alcohol dependence ([place]) Labs pending as below.  -     CBC with Differential/Platelet -     CMP14+EGFR  Mild dementia (HCC) MMSE score of 24 today.   Senile osteoporosis Not currently taking Vitamin D or calcium supplement. Will discuss DEXA at next visit.   Vitamin D deficiency Not currently on repletion therapy. Labs pending as below.  -     VITAMIN D 25 Hydroxy (Vit-D Deficiency, Fractures)  Left inguinal hernia Reducible, not incarcerated. Discussed observation vs referral to general surgery. Patient would prefer to observe for now. Aware of when to seek emergency care.    Colon cancer screening -     Ambulatory referral to Gastroenterology  Encounter to establish care -     CBC with Differential/Platelet -     CMP14+EGFR  Follow-up: Return in about 3 months (around 03/01/2021) for  chronic follow up.   The patient indicates understanding of these issues and agrees with the plan.   Gwenlyn Perking, FNP

## 2020-11-29 NOTE — Patient Instructions (Signed)
Inguinal Hernia, Adult An inguinal hernia develops when fat or the intestines push through a weak spot in a muscle where the leg meets the lower abdomen (groin). This creates a bulge. This kind of hernia could also be:  In the scrotum, if you are male.  In folds of skin around the vagina, if you are male. There are three types of inguinal hernias:  Hernias that can be pushed back into the abdomen (are reducible). This type rarely causes pain.  Hernias that are not reducible (are incarcerated).  Hernias that are not reducible and lose their blood supply (are strangulated). This type of hernia requires emergency surgery. What are the causes? This condition is caused by having a weak spot in the muscles or tissues in your groin. This develops over time. The hernia may poke through the weak spot when you suddenly strain your lower abdominal muscles, such as when you:  Lift a heavy object.  Strain to have a bowel movement. Constipation can lead to straining.  Cough. What increases the risk? This condition is more likely to develop in:  Males.  Pregnant females.  People who: ? Are overweight. ? Work in jobs that require long periods of standing or heavy lifting. ? Have had an inguinal hernia before. ? Smoke or have lung disease. These factors can lead to long-term (chronic) coughing. What are the signs or symptoms? Symptoms may depend on the size of the hernia. Often, a small inguinal hernia has no symptoms. Symptoms of a larger hernia may include:  A bulge in the groin area. This is easier to see when standing. It might not be visible when lying down.  Pain or burning in the groin. This may get worse when lifting, straining, or coughing.  A dull ache or a feeling of pressure in the groin.  An unusual bulge in the scrotum, in males. Symptoms of a strangulated inguinal hernia may include:  A bulge in your groin that is very painful and tender to the touch.  A bulge that  turns red or purple.  Fever, nausea, and vomiting.  Inability to have a bowel movement or to pass gas. How is this diagnosed? This condition is diagnosed based on your symptoms, your medical history, and a physical exam. Your health care provider may feel your groin area and ask you to cough. How is this treated? Treatment depends on the size of your hernia and whether you have symptoms. If you do not have symptoms, your health care provider may have you watch your hernia carefully and have you come in for follow-up visits. If your hernia is large or if you have symptoms, you may need surgery to repair the hernia. Follow these instructions at home: Lifestyle  Avoid lifting heavy objects.  Avoid standing for long periods of time.  Do not use any products that contain nicotine or tobacco. These products include cigarettes, chewing tobacco, and vaping devices, such as e-cigarettes. If you need help quitting, ask your health care provider.  Maintain a healthy weight. Preventing constipation You may need to take these actions to prevent or treat constipation:  Drink enough fluid to keep your urine pale yellow.  Take over-the-counter or prescription medicines.  Eat foods that are high in fiber, such as beans, whole grains, and fresh fruits and vegetables.  Limit foods that are high in fat and processed sugars, such as fried or sweet foods. General instructions  You may try to push the hernia back in place by very gently   pressing on it while lying down. Do not try to force the bulge back in if it will not push in easily.  Watch your hernia for any changes in shape, size, or color. Get help right away if you notice any changes.  Take over-the-counter and prescription medicines only as told by your health care provider.  Keep all follow-up visits. This is important. Contact a health care provider if:  You have a fever or chills.  You develop new symptoms.  Your symptoms get  worse. Get help right away if:  You have pain in your groin that suddenly gets worse.  You have a bulge in your groin that: ? Suddenly gets bigger and does not get smaller. ? Becomes red or purple or painful to the touch.  You are a man and you have a sudden pain in your scrotum, or the size of your scrotum suddenly changes.  You cannot push the hernia back in place by very gently pressing on it when you are lying down.  You have nausea or vomiting that does not go away.  You have a fast heartbeat.  You cannot have a bowel movement or pass gas. These symptoms may represent a serious problem that is an emergency. Do not wait to see if the symptoms will go away. Get medical help right away. Call your local emergency services (911 in the [place] Summary  An inguinal hernia develops when fat or the intestines push through a weak spot in a muscle where your leg meets your lower abdomen (groin).  This condition is caused by having a weak spot in muscles or tissues in your groin.  Symptoms may depend on the size of the hernia, and they may include pain or swelling in your groin. A small inguinal hernia often has no symptoms.  Treatment may not be needed if you do not have symptoms. If you have symptoms or a large hernia, you may need surgery to repair the hernia.  Avoid lifting heavy objects. Also, avoid standing for long periods of time. This information is not intended to replace advice given to you by your health care provider. Make sure you discuss any questions you have with your health care provider. Document Revised: 04/30/2020 Document Reviewed: 04/30/2020 Elsevier Patient Education  2021 Else[employer] Inc.  

## 2020-11-30 LAB — CBC WITH DIFFERENTIAL/PLATELET
Basos: 1 %
EOS (ABSOLUTE): 0.1 10*3/uL (ref 0.0–0.4)
Eos: 1 %
Hematocrit: 44.5 % (ref 37.5–51.0)
Hemoglobin: 14.8 g/dL (ref 13.0–17.7)
Immature Grans (Abs): 0 10*3/uL (ref 0.0–0.1)
Lymphocytes Absolute: 2.1 10*3/uL (ref 0.7–3.1)
Lymphs: 59 %
MCH: 33.4 pg — ABNORMAL HIGH (ref 26.6–33.0)
MCHC: 33.3 g/dL (ref 31.5–35.7)
MCV: 101 fL — ABNORMAL HIGH (ref 79–97)
Monocytes Absolute: 0.3 10*3/uL (ref 0.1–0.9)
Neutrophils Absolute: 1.1 10*3/uL — ABNORMAL LOW (ref 1.4–7.0)
Platelets: 144 10*3/uL — ABNORMAL LOW (ref 150–450)
RDW: 11 % — ABNORMAL LOW (ref 11.6–15.4)
WBC: 3.6 10*3/uL (ref 3.4–10.8)

## 2020-11-30 LAB — CMP14+EGFR
BUN: 10 mg/dL (ref 8–27)
Bilirubin Total: 1.3 mg/dL — ABNORMAL HIGH (ref 0.0–1.2)
Calcium: 8.7 mg/dL (ref 8.6–10.2)
Chloride: 101 mmol/L (ref 96–106)
Creatinine, Ser: 0.71 mg/dL — ABNORMAL LOW (ref 0.76–1.27)
Globulin, Total: 4.2 g/dL (ref 1.5–4.5)
Glucose: 88 mg/dL (ref 65–99)
Potassium: 4.4 mmol/L (ref 3.5–5.2)
Sodium: 139 mmol/L (ref 134–144)
eGFR: 103 mL/min/{1.73_m2} (ref >59)

## 2020-11-30 LAB — VITAMIN D 25 HYDROXY (VIT D DEFICIENCY, FRACTURES): Vit D, 25-Hydroxy: 35.2 ng/mL (ref 30.0–100.0)

## 2020-12-13 ENCOUNTER — Encounter: Payer: Self-pay | Admitting: Family Medicine

## 2021-03-07 ENCOUNTER — Ambulatory Visit: Payer: Medicare Other

## 2021-03-10 ENCOUNTER — Ambulatory Visit (INDEPENDENT_AMBULATORY_CARE_PROVIDER_SITE_OTHER): Payer: Medicare Other | Admitting: Family Medicine

## 2021-03-10 ENCOUNTER — Other Ambulatory Visit: Payer: Self-pay

## 2021-03-10 ENCOUNTER — Encounter: Payer: Self-pay | Admitting: Family Medicine

## 2021-03-10 VITALS — BP 133/71 | HR 85 | Temp 97.8°F | Ht 66.0 in | Wt 120.2 lb

## 2021-03-10 DIAGNOSIS — B182 Chronic viral hepatitis C: Secondary | ICD-10-CM | POA: Diagnosis not present

## 2021-03-10 DIAGNOSIS — F03A0 Unspecified dementia, mild, without behavioral disturbance, psychotic disturbance, mood disturbance, and anxiety: Secondary | ICD-10-CM

## 2021-03-10 DIAGNOSIS — F0390 Unspecified dementia without behavioral disturbance: Secondary | ICD-10-CM

## 2021-03-10 DIAGNOSIS — F1020 Alcohol dependence, uncomplicated: Secondary | ICD-10-CM | POA: Diagnosis not present

## 2021-03-10 NOTE — Patient Instructions (Signed)
Hepatitis C Hepatitis C is a liver infection that is caused by the hepatitis C virus (HCV). The virus infects and causes inflammation in the liver. Hepatitis C can lead to: Loss of liver function (liver failure). Scarring of the liver (cirrhosis). Liver cancer. People with hepatitis C often do not know for months or years that they have this condition. This is because they have no symptoms or may have only mildsymptoms. What are the causes? This condition is caused by HCV. The virus can spread from person to person (is contagious). It can spread through: Contact with an infected person's blood, semen, or vaginal fluids. Childbirth. A woman who has hepatitis C can pass it to her baby during birth. Having received donated blood (blood transfusion) or an organ transplant that was done in the [place] before 1992. What increases the risk? The following factors may make you more likely to develop this condition: Having contact with needles or syringes that have HCV on them (are contaminated). This may happen while injecting drugs, getting a tattoo or body piercing, or receiving acupuncture. Acupuncture is a treatment that inserts thin needles through your skin. Contact also may happen when you: Have sex with someone who is infected. The virus can spread through vaginal, oral, or anal sex. Receive treatment to filter your blood (kidney dialysis). Have a job that involves contact with blood or body fluids, such as in health care. Having HIV (human immunodeficiency virus) or AIDS (acquired immunodeficiency syndrome). What are the signs or symptoms? Symptoms of this condition include: Tiredness (fatigue). Loss of appetite. Nausea or vomiting. Pain in your abdomen. Dark yellow urine. Yellowing of your skin or the white parts of your eyes (jaundice). Itchy skin. Light-colored or tan stool. Joint pain. Bleeding and bruising that happen often. Fluid buildup in your stomach (ascites). Often,  hepatitis C causes no symptoms. How is this diagnosed? This condition is diagnosed with: Blood tests. Other tests that show how well your liver is working. These tests may include: Magnetic resonance elastography (MRE). This imaging test uses MRI and sound waves to measure liver stiffness. Transient elastography. This imaging test uses ultrasound to measure liver stiffness. Liver biopsy. In this test, a tissue sample is taken from your liver and looked at under a microscope. How is this treated? Treatment may depend on how severe your condition is, how long it has lasted, and whether you have liver damage. Treatment may include: Taking antiviral medicines and other medicines. Having follow-up treatments every 6-12 months for infections or other liver problems. Having a liver transplant. Follow these instructions at home: Medicines Take over-the-counter and prescription medicines only as told by your health care provider. If you were prescribed an antiviral medicine, take it as told by your health care provider. Do not stop using the antiviral even if you start to feel better. Do not take any new medicines, including over-the-counter medicines or supplements, unless your health care provider approves. Activity Rest as needed. Do not have sex unless your health care provider approves. Avoid swimming or using hot tubs if you have open sores or wounds. Return to your normal activities as told by your health care provider. Ask your health care provider what activities are safe for you. Ask your health care provider when you may return to school or work. Eating and drinking  Eat a balanced diet with plenty of fruits and vegetables, whole grains, and lean meats or non-meat proteins, such as beans or tofu. Drink enough fluid to keep your urine  pale yellow. Do not drink alcohol.  General instructions Do not share toothbrushes, nail clippers, or razors. Wash your hands often with soap and water  for at least 20 seconds. If soap and water are not available, use alcohol-based hand sanitizer. Cover any cuts or open sores on your skin to prevent spreading HCV. Keep all follow-up visits. This is important. You may need follow-up visits every 6-12 months. How is this prevented? There is no vaccine for hepatitis C. You can lessen your risk of coming into contact with HCV by making sure you: Wash your hands often with soap and water for at least 20 seconds. Do not share needles or syringes. Use a condom every time you have vaginal, oral, or anal sex. Latex condoms offer the best protection. Avoid handling blood or other body fluids without gloves or other protection. Avoid getting tattoos or body piercings in shops that are not clean. Where to find more information Centers for Disease Control and Prevention: InternetEnthusiasts.hu World Health Organization: RoleLink.com.br Contact a health care provider if you: Have a fever or chills. Have pain or swelling in your abdomen. Pass dark urine. Pass light-colored or tan stool. Have joint pain. Get help right away if you: Have more fatigue. Lose your appetite. Cannot eat or drink without vomiting. Develop jaundice, or your jaundice gets worse. Bruise or bleed easily. Summary Hepatitis C is a liver infection that is caused by the hepatitis C virus (HCV). This infection can lead to a loss of liver function (liver failure), scarring of the liver (cirrhosis), or liver cancer. HCV can spread from person to person (is contagious). Do not take any medicines, including over-the-counter medicines or supplements, unless your health care provider approves. This information is not intended to replace advice given to you by your health care provider. Make sure you discuss any questions you have with your healthcare provider. Document Revised: 07/18/2020 Document Reviewed: 07/18/2020 Elsevier Patient Education  2022 [employer].

## 2021-03-10 NOTE — Progress Notes (Signed)
Established Patient Office Visit  Subjective:  Patient ID: Kyle Hammond, male    DOB: [birth year]  Age: 65 y.o. MRN: [id number]  CC:  Chief Complaint  Patient presents with   Medical Management of Chronic Issues    HPI JAMES SENN presents for chronic follow up. A referral was placed at his last visit to GI for chronic Hep C. He reports that he has not seen GI and has noted been notified regarding this referral. He denies abdominal pain, nausea, or vomiting. He reports that he has had a colonoscopy in the last 10 years with [facility], but he cannot remember exactly when it was. His alcohol intake remains the same at about 5 beers a day.    Past Medical History:  Diagnosis Date   Cataract    Hepatitis C     Past Surgical History:  Procedure Laterality Date   CATARACT EXTRACTION, BILATERAL  2019   HIP FRACTURE SURGERY Left 04/2016    Family History  Problem Relation Age of Onset   Cancer Mother        bone   Heart disease Father    Heart attack Father     Social History   Socioeconomic History   Marital status: Divorced    Spouse name: Not on file   Number of children: 2   Years of education: 12   Highest education level: High school graduate  Occupational History   Occupation: disabled  Tobacco Use   Smoking status: Every Day    Packs/day: 1.00    Years: 50.00    Pack years: 50.00    Types: Cigarettes   Smokeless tobacco: Never  Vaping Use   Vaping Use: Never used  Substance and Sexual Activity   Alcohol use: Yes    Alcohol/week: 35.0 standard drinks    Types: 35 Cans of beer per week   Drug use: No   Sexual activity: Not Currently  Other Topics Concern   Not on file  Social History Narrative   Not on file   Social Determinants of Health   Financial Resource Strain: Not on file  Food Insecurity: Not on file  Transportation Needs: Not on file  Physical Activity: Not on file  Stress: Not on file  Social Connections: Not on file  Intimate Partner  Violence: Not on file    Outpatient Medications Prior to Visit  Medication Sig Dispense Refill   acetaminophen (TYLENOL) 500 MG tablet Take 1,000 mg by mouth every 8 (eight) hours as needed.     No facility-administered medications prior to visit.    No Known Allergies  ROS Review of Systems  Constitutional:  Negative for activity change, appetite change, fatigue, fever and unexpected weight change.  Eyes:  Negative for visual disturbance.  Respiratory:  Negative for shortness of breath and wheezing.   Cardiovascular:  Negative for chest pain and leg swelling.  Gastrointestinal:  Negative for abdominal distention, abdominal pain, blood in stool, nausea and vomiting.  Skin:  Negative for color change.     Objective:    Physical Exam Vitals and nursing note reviewed.  Constitutional:      General: He is not in acute distress.    Appearance: He is not ill-appearing, toxic-appearing or diaphoretic.  Eyes:     General: No scleral icterus.    Extraocular Movements: Extraocular movements intact.     Conjunctiva/sclera: Conjunctivae normal.  Cardiovascular:     Rate and Rhythm: Normal rate and regular rhythm.  Heart sounds: Normal heart sounds. No murmur heard. Pulmonary:     Effort: Pulmonary effort is normal.     Breath sounds: Normal breath sounds.  Abdominal:     General: Bowel sounds are normal. There is no distension.     Palpations: Abdomen is soft. There is no mass.     Tenderness: There is no abdominal tenderness. There is no guarding or rebound.     Hernia: No hernia is present.  Musculoskeletal:     Right lower leg: No edema.     Left lower leg: No edema.  Skin:    General: Skin is warm and dry.     Coloration: Skin is not jaundiced.  Neurological:     General: No focal deficit present.     Mental Status: He is alert and oriented to person, place, and time.  Psychiatric:        Mood and Affect: Mood normal.        Behavior: Behavior normal.    BP 133/71    Pulse 85   Temp 97.8 F (36.6 C) (Oral)   Ht '5\' 6"'  (1.676 m)   Wt 120 lb 4 oz (54.5 kg)   BMI 19.41 kg/m  Wt Readings from Last 3 Encounters:  03/10/21 120 lb 4 oz (54.5 kg)  11/29/20 121 lb (54.9 kg)  07/15/16 121 lb (54.9 kg)     Health Maintenance Due  Topic Date Due   Zoster Vaccines- Shingrix (1 of 2) Never done   COVID-19 Vaccine (4 - Booster for Moderna series) 12/19/2020    There are no preventive care reminders to display for this patient.  No results found for: TSH Lab Results  Component Value Date   WBC 3.6 11/29/2020   HGB 14.8 11/29/2020   HCT 44.5 11/29/2020   MCV 101 (H) 11/29/2020   PLT 144 (L) 11/29/2020   Lab Results  Component Value Date   NA 139 11/29/2020   K 4.4 11/29/2020   CO2 23 11/29/2020   GLUCOSE 88 11/29/2020   BUN 10 11/29/2020   CREATININE 0.71 (L) 11/29/2020   BILITOT 1.3 (H) 11/29/2020   ALKPHOS 77 11/29/2020   AST 113 (H) 11/29/2020   ALT 81 (H) 11/29/2020   PROT 8.3 11/29/2020   ALBUMIN 4.1 11/29/2020   CALCIUM 8.7 11/29/2020   ANIONGAP 4 (L) 06/23/2016   EGFR 103 11/29/2020   No results found for: CHOL No results found for: HDL No results found for: LDLCALC No results found for: TRIG No results found for: CHOLHDL No results found for: HGBA1C    Assessment & Plan:   Veron was seen today for medical management of chronic issues.  Diagnoses and all orders for this visit:  Chronic hepatitis C without hepatic coma ([place]) New referral placed to GI today. Labs pending as below.  -     CBC with Differential/Platelet -     CMP14+EGFR -     Ambulatory referral to Gastroenterology  Uncomplicated alcohol dependence ([place]) Labs pending.  -     CBC with Differential/Platelet -     CMP14+EGFR  Mild dementia ([place]) Stable.    Follow-up: Return in about 6 months (around 09/09/2021) for CPE .   The patient indicates understanding of these issues and agrees with the plan.   Gwenlyn Perking, FNP

## 2021-03-11 LAB — CBC WITH DIFFERENTIAL/PLATELET
Basophils Absolute: 0 10*3/uL (ref 0.0–0.2)
Basos: 1 %
EOS (ABSOLUTE): 0.1 10*3/uL (ref 0.0–0.4)
Eos: 2 %
Hematocrit: 36.7 % — ABNORMAL LOW (ref 37.5–51.0)
Hemoglobin: 12.9 g/dL — ABNORMAL LOW (ref 13.0–17.7)
Immature Grans (Abs): 0 10*3/uL (ref 0.0–0.1)
Immature Granulocytes: 1 %
Lymphocytes Absolute: 1.1 10*3/uL (ref 0.7–3.1)
Lymphs: 39 %
MCH: 34.8 pg — ABNORMAL HIGH (ref 26.6–33.0)
MCHC: 35.1 g/dL (ref 31.5–35.7)
MCV: 99 fL — ABNORMAL HIGH (ref 79–97)
Monocytes Absolute: 0.2 10*3/uL (ref 0.1–0.9)
Monocytes: 7 %
Neutrophils Absolute: 1.5 10*3/uL (ref 1.4–7.0)
Neutrophils: 50 %
Platelets: 162 10*3/uL (ref 150–450)
RBC: 3.71 x10E6/uL — ABNORMAL LOW (ref 4.14–5.80)
RDW: 10.8 % — ABNORMAL LOW (ref 11.6–15.4)
WBC: 2.9 10*3/uL — ABNORMAL LOW (ref 3.4–10.8)

## 2021-03-11 LAB — CMP14+EGFR
ALT: 108 IU/L — ABNORMAL HIGH (ref 0–44)
AST: 184 IU/L — ABNORMAL HIGH (ref 0–40)
Albumin/Globulin Ratio: 1.1 — ABNORMAL LOW (ref 1.2–2.2)
Albumin: 4 g/dL (ref 3.8–4.8)
Alkaline Phosphatase: 68 IU/L (ref 44–121)
BUN/Creatinine Ratio: 13 (ref 10–24)
BUN: 9 mg/dL (ref 8–27)
Bilirubin Total: 0.7 mg/dL (ref 0.0–1.2)
CO2: 22 mmol/L (ref 20–29)
Calcium: 8.1 mg/dL — ABNORMAL LOW (ref 8.6–10.2)
Chloride: 102 mmol/L (ref 96–106)
Creatinine, Ser: 0.7 mg/dL — ABNORMAL LOW (ref 0.76–1.27)
Globulin, Total: 3.7 g/dL (ref 1.5–4.5)
Glucose: 118 mg/dL — ABNORMAL HIGH (ref 65–99)
Potassium: 3.8 mmol/L (ref 3.5–5.2)
Sodium: 138 mmol/L (ref 134–144)
Total Protein: 7.7 g/dL (ref 6.0–8.5)
eGFR: 104 mL/min/{1.73_m2} (ref >59)

## 2021-03-24 NOTE — Progress Notes (Signed)
PT r/c about labs

## 2021-04-07 ENCOUNTER — Ambulatory Visit: Payer: Medicare Other

## 2021-07-04 ENCOUNTER — Encounter: Payer: Self-pay | Admitting: *Deleted

## 2021-09-10 ENCOUNTER — Encounter: Payer: Self-pay | Admitting: Internal Medicine

## 2021-09-10 ENCOUNTER — Encounter: Payer: Self-pay | Admitting: Family Medicine

## 2021-09-10 ENCOUNTER — Ambulatory Visit (INDEPENDENT_AMBULATORY_CARE_PROVIDER_SITE_OTHER): Payer: Medicare Other

## 2021-09-10 ENCOUNTER — Encounter: Payer: Medicare Other | Admitting: Family Medicine

## 2021-09-10 ENCOUNTER — Ambulatory Visit (INDEPENDENT_AMBULATORY_CARE_PROVIDER_SITE_OTHER): Payer: Medicare Other | Admitting: Family Medicine

## 2021-09-10 VITALS — BP 147/74 | HR 93 | Temp 98.6°F | Ht 66.0 in | Wt 130.1 lb

## 2021-09-10 DIAGNOSIS — M25561 Pain in right knee: Secondary | ICD-10-CM

## 2021-09-10 DIAGNOSIS — D696 Thrombocytopenia, unspecified: Secondary | ICD-10-CM

## 2021-09-10 DIAGNOSIS — R790 Abnormal level of blood mineral: Secondary | ICD-10-CM

## 2021-09-10 DIAGNOSIS — B182 Chronic viral hepatitis C: Secondary | ICD-10-CM

## 2021-09-10 DIAGNOSIS — Z125 Encounter for screening for malignant neoplasm of prostate: Secondary | ICD-10-CM

## 2021-09-10 DIAGNOSIS — K4090 Unilateral inguinal hernia, without obstruction or gangrene, not specified as recurrent: Secondary | ICD-10-CM | POA: Diagnosis not present

## 2021-09-10 DIAGNOSIS — F1020 Alcohol dependence, uncomplicated: Secondary | ICD-10-CM

## 2021-09-10 DIAGNOSIS — G8929 Other chronic pain: Secondary | ICD-10-CM

## 2021-09-10 DIAGNOSIS — M810 Age-related osteoporosis without current pathological fracture: Secondary | ICD-10-CM | POA: Diagnosis not present

## 2021-09-10 DIAGNOSIS — F17209 Nicotine dependence, unspecified, with unspecified nicotine-induced disorders: Secondary | ICD-10-CM | POA: Insufficient documentation

## 2021-09-10 DIAGNOSIS — E559 Vitamin D deficiency, unspecified: Secondary | ICD-10-CM

## 2021-09-10 DIAGNOSIS — D500 Iron deficiency anemia secondary to blood loss (chronic): Secondary | ICD-10-CM

## 2021-09-10 DIAGNOSIS — B3789 Other sites of candidiasis: Secondary | ICD-10-CM

## 2021-09-10 DIAGNOSIS — N5089 Other specified disorders of the male genital organs: Secondary | ICD-10-CM

## 2021-09-10 MED ORDER — NYSTATIN 100000 UNIT/GM EX CREA: 1.0000 "application " | TOPICAL_CREAM | Freq: Two times a day (BID) | CUTANEOUS | 0 refills | Status: DC

## 2021-09-10 NOTE — Progress Notes (Signed)
Subjective:  Patient ID: Kyle Hammond, male    DOB: [birth year], 65 y.o.   MRN: [id number]  Patient Care Team: Gwenlyn Perking, FNP as PCP - General (Family Medicine)   Chief Complaint:  Annual Exam   HPI: Kyle Hammond is a 65 y.o. male presenting on 09/10/2021 for Annual Exam   Patient presents today for annual physical exam and management of chronic medical conditions.  He has a history of chronic hepatitis C, alcohol dependence, senile osteoporosis, low magnesium, iron deficiency anemia, continued tobacco use, and left inguinal hernia.  He was referred to GI in the past for the hernia and chronic hepatitis C but has not followed up.  He states over the last several weeks he has had swelling into his left testicle from the hernia.  States at times the area will become hard and is slightly painful.  He states there is continued swelling pain.  He denies abnormal bowel movements. He states he does continue to drink alcohol, at least 2 drinks a day, beer, no more liquor.  He does smoke at least 1 pack of cigarettes per day and has never had a lung cancer screening.  He has a diagnosis of senile osteoporosis but no DEXA scan noted in imaging.  He is not on any medications or vitamins.  He states that he does have a rash of his groin area that has been there for several weeks and does not seem to be improving.  It is erythematous and pruritic.  He does report ongoing right knee pain for several months.  No known injuries.  No loss of function.  He has not tried anything for symptoms.     Relevant past medical, surgical, family, and social history reviewed and updated as indicated.  Allergies and medications reviewed and updated. Data reviewed: Chart in Epic.   Past Medical History:  Diagnosis Date   Cataract    Hepatitis C     Past Surgical History:  Procedure Laterality Date   CATARACT EXTRACTION, BILATERAL  2019   HIP FRACTURE SURGERY Left 04/2016    Social History    Socioeconomic History   Marital status: Divorced    Spouse name: Not on file   Number of children: 2   Years of education: 12   Highest education level: High school graduate  Occupational History   Occupation: disabled  Tobacco Use   Smoking status: Every Day    Packs/day: 1.00    Years: 50.00    Pack years: 50.00    Types: Cigarettes   Smokeless tobacco: Never  Vaping Use   Vaping Use: Never used  Substance and Sexual Activity   Alcohol use: Yes    Alcohol/week: 35.0 standard drinks    Types: 35 Cans of beer per week   Drug use: No   Sexual activity: Not Currently  Other Topics Concern   Not on file  Social History Narrative   Not on file   Social Determinants of Health   Financial Resource Strain: Not on file  Food Insecurity: Not on file  Transportation Needs: Not on file  Physical Activity: Not on file  Stress: Not on file  Social Connections: Not on file  Intimate Partner Violence: Not on file    Outpatient Encounter Medications as of 09/10/2021  Medication Sig   nystatin cream (MYCOSTATIN) Apply 1 application topically 2 (two) times daily for 10 days.   [DISCONTINUED] acetaminophen (TYLENOL) 500 MG tablet Take 1,000 mg by  mouth every 8 (eight) hours as needed.   No facility-administered encounter medications on file as of 09/10/2021.    No Known Allergies  Review of Systems  Constitutional:  Negative for activity change, appetite change, chills, diaphoresis, fatigue, fever and unexpected weight change.  Eyes:  Negative for photophobia and visual disturbance.  Respiratory:  Negative for apnea, cough, choking, chest tightness, shortness of breath, wheezing and stridor.   Cardiovascular:  Negative for chest pain, palpitations and leg swelling.  Gastrointestinal:  Negative for abdominal distention, abdominal pain, anal bleeding, blood in stool, constipation, diarrhea, nausea, rectal pain and vomiting.       Left inguinal swelling with pain at times   Genitourinary:  Positive for scrotal swelling. Negative for decreased urine volume, difficulty urinating, dysuria, enuresis, flank pain, frequency, genital sores, hematuria, penile discharge, penile pain, penile swelling, testicular pain and urgency.  Musculoskeletal:  Positive for arthralgias, gait problem and joint swelling (Right knee). Negative for back pain, myalgias, neck pain and neck stiffness.  Skin:  Negative for color change.  Neurological:  Negative for dizziness, tremors, seizures, syncope, facial asymmetry, speech difficulty, weakness, light-headedness, numbness and headaches.  Psychiatric/Behavioral:  Negative for confusion.   All other systems reviewed and are negative.      Objective:  BP (!) 147/74    Pulse 93    Temp 98.6 F (37 C) (Temporal)    Ht '5\' 6"'  (1.676 m)    Wt 130 lb 2 oz (59 kg)    BMI 21.00 kg/m    Wt Readings from Last 3 Encounters:  09/10/21 130 lb 2 oz (59 kg)  03/10/21 120 lb 4 oz (54.5 kg)  11/29/20 121 lb (54.9 kg)    Physical Exam Vitals and nursing note reviewed. Exam conducted with a chaperone present.  Constitutional:      General: He is not in acute distress.    Appearance: Normal appearance. He is normal weight. He is not ill-appearing, toxic-appearing or diaphoretic.  HENT:     Head: Normocephalic and atraumatic.     Right Ear: Tympanic membrane, ear canal and external ear normal.     Left Ear: Tympanic membrane, ear canal and external ear normal.     Nose: Nose normal.     Mouth/Throat:     Mouth: Mucous membranes are moist.     Pharynx: Oropharynx is clear.  Eyes:     Conjunctiva/sclera: Conjunctivae normal.     Pupils: Pupils are equal, round, and reactive to light.  Cardiovascular:     Rate and Rhythm: Normal rate and regular rhythm.     Pulses: Normal pulses.     Heart sounds: Normal heart sounds.  Pulmonary:     Effort: Pulmonary effort is normal.     Breath sounds: Rhonchi (scattered, clears with coughing) present.   Abdominal:     General: Bowel sounds are normal.     Palpations: Abdomen is soft.     Hernia: A hernia is present. Hernia is present in the left inguinal area (With swelling into the scrotum and tenderness). There is no hernia in the right inguinal area.  Genitourinary:    Pubic Area: Rash (Beefy red rash to bilateral groin area with noted satellite lesions) present. No pubic lice.      Penis: Circumcised. No phimosis, paraphimosis, hypospadias, erythema, tenderness, discharge, swelling or lesions.      Testes: Normal.     Epididymis:     Right: Normal.     Left: Normal.  Tanner stage (genital): 5.     Comments: Left inguinal hernia soft and partially reducible.  Slightly tender to touch. Musculoskeletal:     Cervical back: Normal range of motion and neck supple.     Right upper leg: Normal.     Left upper leg: Normal.     Right knee: No swelling, deformity, effusion, erythema, ecchymosis, lacerations, bony tenderness or crepitus. Normal range of motion. Tenderness present. No medial joint line tenderness. No LCL laxity, MCL laxity, ACL laxity or PCL laxity. Normal alignment, normal meniscus and normal patellar mobility.     Left knee: Normal.     Right lower leg: Normal.     Left lower leg: Normal.  Lymphadenopathy:     Lower Body: No right inguinal adenopathy.  Neurological:     Mental Status: He is alert.    Results for orders placed or performed in visit on 03/10/21  CBC with Differential/Platelet  Result Value Ref Range   WBC 2.9 (L) 3.4 - 10.8 x10E3/uL   RBC 3.71 (L) 4.14 - 5.80 x10E6/uL   Hemoglobin 12.9 (L) 13.0 - 17.7 g/dL   Hematocrit 36.7 (L) 37.5 - 51.0 %   MCV 99 (H) 79 - 97 fL   MCH 34.8 (H) 26.6 - 33.0 pg   MCHC 35.1 31.5 - 35.7 g/dL   RDW 10.8 (L) 11.6 - 15.4 %   Platelets 162 150 - 450 x10E3/uL   Neutrophils 50 Not Estab. %   Lymphs 39 Not Estab. %   Monocytes 7 Not Estab. %   Eos 2 Not Estab. %   Basos 1 Not Estab. %   Neutrophils Absolute 1.5 1.4 -  7.0 x10E3/uL   Lymphocytes Absolute 1.1 0.7 - 3.1 x10E3/uL   Monocytes Absolute 0.2 0.1 - 0.9 x10E3/uL   EOS (ABSOLUTE) 0.1 0.0 - 0.4 x10E3/uL   Basophils Absolute 0.0 0.0 - 0.2 x10E3/uL   Immature Granulocytes 1 Not Estab. %   Immature Grans (Abs) 0.0 0.0 - 0.1 x10E3/uL  CMP14+EGFR  Result Value Ref Range   Glucose 118 (H) 65 - 99 mg/dL   BUN 9 8 - 27 mg/dL   Creatinine, Ser 0.70 (L) 0.76 - 1.27 mg/dL   eGFR 104 >59 mL/min/1.73   BUN/Creatinine Ratio 13 10 - 24   Sodium 138 134 - 144 mmol/L   Potassium 3.8 3.5 - 5.2 mmol/L   Chloride 102 96 - 106 mmol/L   CO2 22 20 - 29 mmol/L   Calcium 8.1 (L) 8.6 - 10.2 mg/dL   Total Protein 7.7 6.0 - 8.5 g/dL   Albumin 4.0 3.8 - 4.8 g/dL   Globulin, Total 3.7 1.5 - 4.5 g/dL   Albumin/Globulin Ratio 1.1 (L) 1.2 - 2.2   Bilirubin Total 0.7 0.0 - 1.2 mg/dL   Alkaline Phosphatase 68 44 - 121 IU/L   AST 184 (H) 0 - 40 IU/L   ALT 108 (H) 0 - 44 IU/L       Pertinent labs & imaging results that were available during my care of the patient were reviewed by me and considered in my medical decision making.  Assessment & Plan:  Gillermo was seen today for annual exam.  Diagnoses and all orders for this visit:  Chronic hepatitis C without hepatic coma ([place]) Labs pending.  New referral to GI placed.  Aware of importance of follow-up. -     CMP14+EGFR -     HIV Antibody (routine testing w rflx) -     Ambulatory referral  to Gastroenterology  Uncomplicated alcohol dependence ([place]) Labs pending, cessation encouraged. -     CMP14+EGFR -     Lipid panel -     Thyroid Panel With TSH  Tobacco use disorder, continuous Will obtain lung cancer screening CT. -     CT CHEST LUNG CA SCREEN LOW DOSE W/O CM; Future  Left inguinal hernia Ongoing symptoms of left inguinal hernia now with swelling into the scrotum.  Scrotal ultrasound ordered.  Referral to GI placed.  Patient aware of symptoms which require emergent evaluation. -     Ambulatory referral to  Gastroenterology -     US SCROTUM W/DOPPLER; Future  Candida rash of groin Candida rash of groin.  Will treat with below.  Patient aware to use until resolution of rash, continue for 2 days after resolution of rash. -     nystatin cream (MYCOSTATIN); Apply 1 application topically 2 (two) times daily for 10 days.  Senile osteoporosis Will obtain DEXA today. -     CMP14+EGFR -     DG [facility] DEXA; Future  Vitamin D deficiency Labs pending.  Will initiate repletion therapy if warranted. -     VITAMIN D 25 Hydroxy (Vit-D Deficiency, Fractures)  Low serum magnesium level Will repeat magnesium level today. -     Magnesium  Iron deficiency anemia due to chronic blood loss Labs pending.  Will initiate iron therapy as warranted. -     CMP14+EGFR -     Anemia Profile B  Screening for prostate cancer PSA ordered. -     PSA, total and free  Scrotal swelling Ultrasound ordered. -     US SCROTUM W/DOPPLER; Future  Chronic pain of right knee Symptomatic care discussed in detail.  Patient aware to report any new or worsening symptoms.    Continue all other maintenance medications.  Follow up plan: Return in about 3 months (around 12/09/2021), or if symptoms worsen or fail to improve, for PCP.   Continue healthy lifestyle choices, including diet (rich in fruits, vegetables, and lean proteins, and low in salt and simple carbohydrates) and exercise (at least 30 minutes of moderate physical activity daily).  Educational handout given for maintenance  The above assessment and management plan was discussed with the patient. The patient verbalized understanding of and has agreed to the management plan. Patient is aware to call the clinic if they develop any new symptoms or if symptoms persist or worsen. Patient is aware when to return to the clinic for a follow-up visit. Patient educated on when it is appropriate to go to the emergency department.   Monia Pouch, FNP-[facility] [phone]

## 2021-09-11 ENCOUNTER — Other Ambulatory Visit: Payer: Self-pay

## 2021-09-11 DIAGNOSIS — D500 Iron deficiency anemia secondary to blood loss (chronic): Secondary | ICD-10-CM

## 2021-09-11 DIAGNOSIS — D696 Thrombocytopenia, unspecified: Secondary | ICD-10-CM

## 2021-09-11 LAB — ANEMIA PROFILE B
Basophils Absolute: 0 10*3/uL (ref 0.0–0.2)
Basos: 1 %
EOS (ABSOLUTE): 0 10*3/uL (ref 0.0–0.4)
Eos: 1 %
Ferritin: 414 ng/mL — ABNORMAL HIGH (ref 30–400)
Folate: 6.1 ng/mL (ref >3.0)
Hematocrit: 42.4 % (ref 37.5–51.0)
Hemoglobin: 14.4 g/dL (ref 13.0–17.7)
Immature Grans (Abs): 0 10*3/uL (ref 0.0–0.1)
Immature Granulocytes: 0 %
Iron Saturation: 76 % (ref 15–55)
Iron: 267 ug/dL (ref 38–169)
Lymphocytes Absolute: 1.5 10*3/uL (ref 0.7–3.1)
Lymphs: 42 %
MCH: 33.3 pg — ABNORMAL HIGH (ref 26.6–33.0)
MCHC: 34 g/dL (ref 31.5–35.7)
MCV: 98 fL — ABNORMAL HIGH (ref 79–97)
Monocytes Absolute: 0.2 10*3/uL (ref 0.1–0.9)
Monocytes: 6 %
Neutrophils Absolute: 1.8 10*3/uL (ref 1.4–7.0)
Neutrophils: 50 %
Platelets: 137 10*3/uL — ABNORMAL LOW (ref 150–450)
RBC: 4.33 x10E6/uL (ref 4.14–5.80)
RDW: 12.1 % (ref 11.6–15.4)
Retic Ct Pct: 1.9 % (ref 0.6–2.6)
Total Iron Binding Capacity: 351 ug/dL (ref 250–450)
UIBC: 84 ug/dL — ABNORMAL LOW (ref 111–343)
Vitamin B-12: 476 pg/mL (ref 232–1245)
WBC: 3.6 10*3/uL (ref 3.4–10.8)

## 2021-09-11 LAB — CMP14+EGFR
ALT: 115 IU/L — ABNORMAL HIGH (ref 0–44)
AST: 173 IU/L — ABNORMAL HIGH (ref 0–40)
Albumin/Globulin Ratio: 1.1 — ABNORMAL LOW (ref 1.2–2.2)
Albumin: 4.3 g/dL (ref 3.8–4.8)
Alkaline Phosphatase: 79 IU/L (ref 44–121)
BUN/Creatinine Ratio: 13 (ref 10–24)
BUN: 10 mg/dL (ref 8–27)
Bilirubin Total: 1.3 mg/dL — ABNORMAL HIGH (ref 0.0–1.2)
CO2: 26 mmol/L (ref 20–29)
Calcium: 9 mg/dL (ref 8.6–10.2)
Chloride: 98 mmol/L (ref 96–106)
Creatinine, Ser: 0.76 mg/dL (ref 0.76–1.27)
Globulin, Total: 4 g/dL (ref 1.5–4.5)
Glucose: 71 mg/dL (ref 70–99)
Potassium: 3.6 mmol/L (ref 3.5–5.2)
Sodium: 137 mmol/L (ref 134–144)
Total Protein: 8.3 g/dL (ref 6.0–8.5)
eGFR: 100 mL/min/{1.73_m2} (ref >59)

## 2021-09-11 LAB — THYROID PANEL WITH TSH
Free Thyroxine Index: 1.5 (ref 1.2–4.9)
T3 Uptake Ratio: 24 % (ref 24–39)
T4, Total: 6.1 ug/dL (ref 4.5–12.0)
TSH: 2.75 u[IU]/mL (ref 0.450–4.500)

## 2021-09-11 LAB — VITAMIN D 25 HYDROXY (VIT D DEFICIENCY, FRACTURES): Vit D, 25-Hydroxy: 25 ng/mL — ABNORMAL LOW (ref 30.0–100.0)

## 2021-09-11 LAB — LIPID PANEL
Chol/HDL Ratio: 1.9 ratio (ref 0.0–5.0)
Cholesterol, Total: 203 mg/dL — ABNORMAL HIGH (ref 100–199)
HDL: 106 mg/dL (ref >39)
LDL Chol Calc (NIH): 83 mg/dL (ref 0–99)
Triglycerides: 77 mg/dL (ref 0–149)
VLDL Cholesterol Cal: 14 mg/dL (ref 5–40)

## 2021-09-11 LAB — HIV ANTIBODY (ROUTINE TESTING W REFLEX): HIV Screen 4th Generation wRfx: NONREACTIVE

## 2021-09-11 LAB — PSA, TOTAL AND FREE
PSA, Free Pct: 20.8 %
PSA, Free: 0.54 ng/mL
Prostate Specific Ag, Serum: 2.6 ng/mL (ref 0.0–4.0)

## 2021-09-11 LAB — MAGNESIUM: Magnesium: 1.2 mg/dL — ABNORMAL LOW (ref 1.6–2.3)

## 2021-09-11 NOTE — Progress Notes (Signed)
Pt has been informed of results.  He is aware to start take Vit D daily and Mg daily.   He is aware of GI referral and Hematology referral.  Made pt aware of foods to avoid and foods recommended.  Hematoloy referral has been placed as well as gi referral. Pt would like to stay close to home due to transportation concerns.

## 2021-09-11 NOTE — Addendum Note (Signed)
Addended by: Baruch Gouty on: 09/11/2021 10:21 AM   Modules accepted: Orders

## 2021-09-12 ENCOUNTER — Other Ambulatory Visit: Payer: Self-pay

## 2021-09-12 ENCOUNTER — Ambulatory Visit (HOSPITAL_COMMUNITY)
Admission: RE | Admit: 2021-09-12 | Discharge: 2021-09-12 | Disposition: A | Discharge status: Home / Self Care | Payer: Medicare Other | Source: Ambulatory Visit | Attending: Family Medicine | Admitting: Family Medicine

## 2021-09-12 ENCOUNTER — Ambulatory Visit (INDEPENDENT_AMBULATORY_CARE_PROVIDER_SITE_OTHER): Payer: Medicare Other

## 2021-09-12 DIAGNOSIS — Z0000 Encounter for general adult medical examination without abnormal findings: Secondary | ICD-10-CM

## 2021-09-12 DIAGNOSIS — K4090 Unilateral inguinal hernia, without obstruction or gangrene, not specified as recurrent: Secondary | ICD-10-CM | POA: Diagnosis not present

## 2021-09-12 DIAGNOSIS — N5089 Other specified disorders of the male genital organs: Secondary | ICD-10-CM | POA: Diagnosis present

## 2021-09-12 NOTE — Progress Notes (Signed)
MEDICARE ANNUAL WELLNESS VISIT  09/12/2021  Telephone Visit Disclaimer This Medicare AWV was conducted by telephone due to national recommendations for restrictions regarding the COVID-19 Pandemic (e.g. social distancing).  I verified, using two identifiers, that I am speaking with Kyle Hammond or their authorized healthcare agent. I discussed the limitations, risks, security, and privacy concerns of performing an evaluation and management service by telephone and the potential availability of an in-person appointment in the future. The patient expressed understanding and agreed to proceed.  Location of Patient: Home Location of Provider (nurse):  [facility]  Subjective:    Kyle Hammond is a 65 y.o. male patient of Gwenlyn Perking, FNP who had a Medicare Annual Wellness Visit today via telephone. Titus is Retired and lives alone. he has two children. One son has passed. he reports that he is socially active and does interact with friends/family regularly. he is minimally physically active and enjoys walking and fishing when he can.  Patient Care Team: Gwenlyn Perking, FNP as PCP - General (Family Medicine)  Advanced Directives 09/12/2021 06/22/2016 05/19/2016  Does Patient Have a Medical Advance Directive? No Yes Yes  Type of Advance Directive - Out of facility DNR (pink MOST or yellow form) Out of facility DNR (pink MOST or yellow form)  Does patient want to make changes to medical advance directive? - No - Patient declined No - Patient declined  Copy of [place] in Chart? - Yes Yes  Would patient like information on creating a medical advance directive? No - Patient declined [facility] Utilization Over the Past 12 Months: # of hospitalizations or ER visits: 0 # of surgeries: 0  Review of Systems    Patient reports that his overall health is unchanged compared to last year.  Negative except    Patient Reported Readings  (BP, Pulse, CBG, Weight, etc) none  Pain Assessment Pain : No/denies pain     Current Medications & Allergies (verified) Allergies as of 09/12/2021   No Known Allergies      Medication List        Accurate as of September 12, 2021  9:19 AM. If you have any questions, ask your nurse or doctor.          STOP taking these medications    nystatin cream Commonly known as: MYCOSTATIN        History (reviewed): Past Medical History:  Diagnosis Date   Cataract    Hepatitis C    Past Surgical History:  Procedure Laterality Date   CATARACT EXTRACTION, BILATERAL  2019   HIP FRACTURE SURGERY Left 04/2016   Family History  Problem Relation Age of Onset   Cancer Mother        bone   Heart disease Father    Heart attack Father    Social History   Socioeconomic History   Marital status: Divorced    Spouse name: Not on file   Number of children: 2   Years of education: 12   Highest education level: High school graduate  Occupational History   Occupation: disabled  Tobacco Use   Smoking status: Every Day    Packs/day: 1.00    Years: 50.00    Pack years: 50.00    Types: Cigarettes   Smokeless tobacco: Never  Vaping Use   Vaping Use: Never used  Substance and Sexual Activity   Alcohol use: Yes    Alcohol/week: 35.0  standard drinks    Types: 35 Cans of beer per week   Drug use: No   Sexual activity: Not Currently  Other Topics Concern   Not on file  Social History Narrative   Not on file   Social Determinants of Health   Financial Resource Strain: Not on file  Food Insecurity: Not on file  Transportation Needs: Not on file  Physical Activity: Not on file  Stress: Not on file  Social Connections: Not on file    Activities of Daily Living In your present state of health, do you have any difficulty performing the following activities: 09/12/2021  Hearing? N  Vision? N  Difficulty concentrating or making decisions? Y  Walking or climbing stairs? N   Dressing or bathing? N  Doing errands, shopping? N  Preparing Food and eating ? N  Using the Toilet? N  In the past six months, have you accidently leaked urine? N  Do you have problems with loss of bowel control? N  Managing your Medications? N  Managing your Finances? N  Housekeeping or managing your Housekeeping? N  Some recent data might be hidden    Patient Education/ Literacy How often do you need to have someone help you when you read instructions, pamphlets, or other written materials from your doctor or pharmacy?: 2 - Rarely What is the last grade level you completed in school?: 12th  Exercise Current Exercise Habits: Home exercise routine, Type of exercise: walking, Time (Minutes): 45, Frequency (Times/Week): 7, Weekly Exercise (Minutes/Week): 315, Intensity: Mild  Diet Patient reports consuming 2 meals a day and 0 snack(s) a day Patient reports that his primary diet is: Regular Patient reports that he does have regular access to food.   Depression Screen PHQ 2/9 Scores 03/10/2021 11/29/2020 07/15/2016  PHQ - 2 Score 0 0 0     Fall Risk Fall Risk  09/12/2021 03/10/2021 11/29/2020  Falls in the past year? 0 0 0  Risk for fall due to : - - History of fall(s)     Objective:  Kyle Hammond seemed alert and oriented and he participated appropriately during our telephone visit.  Blood Pressure Weight BMI  BP Readings from Last 3 Encounters:  09/10/21 (!) 147/74  03/10/21 133/71  11/29/20 137/75   Wt Readings from Last 3 Encounters:  09/10/21 130 lb 2 oz (59 kg)  03/10/21 120 lb 4 oz (54.5 kg)  11/29/20 121 lb (54.9 kg)   BMI Readings from Last 1 Encounters:  09/10/21 21.00 kg/m    *Unable to obtain current vital signs, weight, and BMI due to telephone visit type  Hearing/Vision  Nicholos did not seem to have difficulty with hearing/understanding during the telephone conversation Reports that he has not had a formal eye exam by an eye care professional within the  past year Reports that he has not had a formal hearing evaluation within the past year *Unable to fully assess hearing and vision during telephone visit type  Cognitive Function: 6CIT Screen 09/12/2021  What Year? 0 points  What month? 0 points  What time? 0 points  Count back from 20 2 points  Months in reverse 2 points  Repeat phrase 10 points  Total Score 14   (Normal:0-7, Significant for Dysfunction: >8)  Normal Cognitive Function Screening: No: 14   Immunization & Health Maintenance Record Immunization History  Administered Date(s) Administered   Influenza,inj,Quad PF,6+ Mos 06/06/2020   Influenza-Unspecified 06/12/2016, 06/06/2021   Moderna Sars-Covid-2 Vaccination 12/21/2019, 01/18/2020, 08/20/2020, 03/04/2021  Pneumococcal-Unspecified 06/24/2016    Health Maintenance  Topic Date Due   COVID-19 Vaccine (5 - Booster for Moderna series) 09/26/2021 (Originally 04/29/2021)   TETANUS/TDAP  11/29/2021 (Originally 07/08/1976)   Zoster Vaccines- Shingrix (1 of 2) 12/09/2021 (Originally 07/09/2007)   Pneumococcal Vaccine 1-65 Years old (1 - PCV) 03/10/2022 (Originally 07/09/1962)   COLONOSCOPY (Pts 45-59yrs Insurance coverage will need to be confirmed)  03/10/2022 (Originally 07/08/2002)   INFLUENZA VACCINE  Completed   Hepatitis C Screening  Completed   HIV Screening  Completed   HPV VACCINES  Aged Out       Assessment  This is a routine wellness examination for Kyle Hammond.  Health Maintenance: Due or Overdue There are no preventive care reminders to display for this patient.  Kyle Hammond does not need a referral for Community Assistance: Care Management:   no Social Work:    no Prescription Assistance:  no Nutrition/Diabetes Education:  no   Plan:  Personalized Goals  Goals Addressed   None    Personalized Health Maintenance & Screening Recommendations  Td vaccine Colorectal cancer screening Shingles Vaccine  Lung Cancer Screening Recommended:  no (Low Dose CT Chest recommended if Age 24-80 years, 30 pack-year currently smoking OR have quit w/in past 15 years) Hepatitis C Screening recommended: no HIV Screening recommended: no  Advanced Directives: Written information was not prepared per patient's request.  Referrals & Orders No orders of the defined types were placed in this encounter.   Follow-up Plan Follow-up with Gwenlyn Perking, FNP as planned Schedule 12/10/2021    I have personally reviewed and noted the following in the patients chart:   Medical and social history Use of alcohol, tobacco or illicit drugs  Current medications and supplements Functional ability and status Nutritional status Physical activity Advanced directives List of other physicians Hospitalizations, surgeries, and ER visits in previous 12 months Vitals Screenings to include cognitive, depression, and falls Referrals and appointments  In addition, I have reviewed and discussed with Kyle Hammond certain preventive protocols, quality metrics, and best practice recommendations. A written personalized care plan for preventive services as well as general preventive health recommendations is available and can be mailed to the patient at his request.      Maud Deed [facility]  [id number]

## 2021-09-12 NOTE — Patient Instructions (Addendum)
°  [place] Maintenance Summary and Written Plan of Care  [name] ,  Thank you for allowing me to perform your Medicare Annual Wellness Visit and for your ongoing commitment to your health.   Health Maintenance & Immunization History Health Maintenance  Topic Date Due   COVID-19 Vaccine (5 - Booster for Moderna series) 09/26/2021 (Originally 04/29/2021)   TETANUS/TDAP  11/29/2021 (Originally 07/08/1976)   Zoster Vaccines- Shingrix (1 of 2) 12/09/2021 (Originally 07/09/2007)   Pneumococcal Vaccine 16-2 Years old (1 - PCV) 03/10/2022 (Originally 07/09/1962)   COLONOSCOPY (Pts 45-51yrs Insurance coverage will need to be confirmed)  03/10/2022 (Originally 07/08/2002)   INFLUENZA VACCINE  Completed   Hepatitis C Screening  Completed   HIV Screening  Completed   HPV VACCINES  Aged Out   Immunization History  Administered Date(s) Administered   Influenza,inj,Quad PF,6+ Mos 06/06/2020   Influenza-Unspecified 06/12/2016, 06/06/2021   Moderna Sars-Covid-2 Vaccination 12/21/2019, 01/18/2020, 08/20/2020, 03/04/2021   Pneumococcal-Unspecified 06/24/2016    These are the patient goals that we discussed:  Goals Addressed   None      This is a list of Health Maintenance Items that are overdue or due now: There are no preventive care reminders to display for this patient.   Orders/Referrals Placed Today: No orders of the defined types were placed in this encounter.  (Contact our referral department at [phone] if you have not spoken with someone about your referral appointment within the next 5 days)    Follow-up Plan  Scheduled with Marjorie Smolder, NP on 12/10/2021 at 11:00am

## 2021-09-17 NOTE — Progress Notes (Signed)
[place] [place], [place] [address]   CLINIC:  Medical Oncology/Hematology  Patient Care Team: Kyle Perking, FNP as PCP - General (Family Medicine)  CHIEF COMPLAINTS/PURPOSE OF CONSULTATION:  Evaluation of thrombocytopenia  HISTORY OF PRESENTING ILLNESS:  Kyle Hammond 66 y.o. male is here because of thrombocytopenia, at the request of [facility].  Today he reports feeling good. He denies easy bleeding and bruising. He had a surgery 4 years ago to repair a fractured hip, and he denies any excessive bleeding at that time. He denies fevers, night sweats, and weight loss. He denies history of blood transfusions. He has a history of Hepatitis C for which he has not been treated. He denies history of CVA and MI. His appetite is good.   He currently lives at home on his own. He is currently on disability, but previously he worked as a Government social research officer. He is a smoker and he has smoked 1 ppd for 40 years. His mother had bone cancer, his brother has lung cancer treated with lobectomy with extensive smoking history, and his maternal uncle had an unspecified type of cancer with an extensive smoking history.   MEDICAL HISTORY:  Past Medical History:  Diagnosis Date   Cataract    Hepatitis C     SURGICAL HISTORY: Past Surgical History:  Procedure Laterality Date   CATARACT EXTRACTION, BILATERAL  2019   HIP FRACTURE SURGERY Left 04/2016    SOCIAL HISTORY: Social History   Socioeconomic History   Marital status: Divorced    Spouse name: Not on file   Number of children: 2   Years of education: 12   Highest education level: High school graduate  Occupational History   Occupation: disabled  Tobacco Use   Smoking status: Every Day    Packs/day: 1.00    Years: 50.00    Pack years: 50.00    Types: Cigarettes   Smokeless tobacco: Never  Vaping Use   Vaping Use: Never used  Substance and Sexual Activity   Alcohol use: Yes    Alcohol/week: 12.0 standard  drinks    Types: 12 Cans of beer per week   Drug use: No   Sexual activity: Not Currently  Other Topics Concern   Not on file  Social History Narrative   Not on file   Social Determinants of Health   Financial Resource Strain: Not on file  Food Insecurity: Not on file  Transportation Needs: Not on file  Physical Activity: Not on file  Stress: Not on file  Social Connections: Not on file  Intimate Partner Violence: Not on file    FAMILY HISTORY: Family History  Problem Relation Age of Onset   Cancer Mother        bone   Heart disease Father    Heart attack Father     ALLERGIES:  has No Known Allergies.  MEDICATIONS:  No current outpatient medications on file.   No current facility-administered medications for this visit.    REVIEW OF SYSTEMS:   Review of Systems  Constitutional:  Negative for appetite change, fatigue, fever and unexpected weight change.  Hematological:  Does not bruise/bleed easily.  All other systems reviewed and are negative.   PHYSICAL EXAMINATION: ECOG PERFORMANCE STATUS: 1 - Symptomatic but completely ambulatory  Vitals:   09/18/21 0933  BP: (!) 151/89  Pulse: 73  Resp: 18  Temp: 98.7 F (37.1 C)  SpO2: 100%   Filed Weights   09/18/21 0933  Weight: 129 lb 1.6 oz (58.6 kg)   Physical Exam Vitals reviewed.  Constitutional:      Appearance: Normal appearance.  Cardiovascular:     Rate and Rhythm: Normal rate and regular rhythm.     Pulses: Normal pulses.     Heart sounds: Normal heart sounds.  Pulmonary:     Effort: Pulmonary effort is normal.     Breath sounds: Normal breath sounds.  Abdominal:     Palpations: Abdomen is soft. There is hepatomegaly (liver edge palpable). There is no splenomegaly or mass.     Tenderness: There is no abdominal tenderness.  Musculoskeletal:     Right lower leg: No edema.     Left lower leg: No edema.  Lymphadenopathy:     Cervical: No cervical adenopathy.     Right cervical: No superficial  cervical adenopathy.    Left cervical: No superficial cervical adenopathy.     Upper Body:     Right upper body: No supraclavicular adenopathy.     Left upper body: No supraclavicular adenopathy.  Neurological:     General: No focal deficit present.     Mental Status: He is alert and oriented to person, place, and time.  Psychiatric:        Mood and Affect: Mood normal.        Behavior: Behavior normal.     LABORATORY DATA:  I have reviewed the data as listed Recent Results (from the past 2160 hour(s))  CMP14+EGFR     Status: Abnormal   Collection Time: 09/10/21 12:01 PM  Result Value Ref Range   Glucose 71 70 - 99 mg/dL   BUN 10 8 - 27 mg/dL   Creatinine, Ser 0.76 0.76 - 1.27 mg/dL   eGFR 100 >59 mL/min/1.73   BUN/Creatinine Ratio 13 10 - 24   Sodium 137 134 - 144 mmol/L   Potassium 3.6 3.5 - 5.2 mmol/L   Chloride 98 96 - 106 mmol/L   CO2 26 20 - 29 mmol/L   Calcium 9.0 8.6 - 10.2 mg/dL   Total Protein 8.3 6.0 - 8.5 g/dL   Albumin 4.3 3.8 - 4.8 g/dL   Globulin, Total 4.0 1.5 - 4.5 g/dL   Albumin/Globulin Ratio 1.1 (L) 1.2 - 2.2   Bilirubin Total 1.3 (H) 0.0 - 1.2 mg/dL   Alkaline Phosphatase 79 44 - 121 IU/L   AST 173 (H) 0 - 40 IU/L   ALT 115 (H) 0 - 44 IU/L  Lipid panel     Status: Abnormal   Collection Time: 09/10/21 12:01 PM  Result Value Ref Range   Cholesterol, Total 203 (H) 100 - 199 mg/dL   Triglycerides 77 0 - 149 mg/dL   HDL 106 >39 mg/dL   VLDL Cholesterol Cal 14 5 - 40 mg/dL   LDL Chol Calc (NIH) 83 0 - 99 mg/dL   Chol/HDL Ratio 1.9 0.0 - 5.0 ratio    Comment:                                   T. Chol/HDL Ratio                                             Men  Women  1/2 Avg.Risk  3.4    3.3                                   Avg.Risk  5.0    4.4                                2X Avg.Risk  9.6    7.1                                3X Avg.Risk 23.4   11.0   Thyroid Panel With TSH     Status: None   Collection Time: 09/10/21  12:01 PM  Result Value Ref Range   TSH 2.750 0.450 - 4.500 uIU/mL   T4, Total 6.1 4.5 - 12.0 ug/dL   T3 Uptake Ratio 24 24 - 39 %   Free Thyroxine Index 1.5 1.2 - 4.9  HIV Antibody (routine testing w rflx)     Status: None   Collection Time: 09/10/21 12:01 PM  Result Value Ref Range   HIV Screen 4th Generation wRfx Non Reactive Non Reactive    Comment: HIV Negative HIV-1/HIV-2 antibodies and HIV-1 p24 antigen were NOT detected. There is no laboratory evidence of HIV infection.   PSA, total and free     Status: None   Collection Time: 09/10/21 12:01 PM  Result Value Ref Range   Prostate Specific Ag, Serum 2.6 0.0 - 4.0 ng/mL    Comment: Roche ECLIA methodology. According to the American Urological Association, Serum PSA should decrease and remain at undetectable levels after radical prostatectomy. The AUA defines biochemical recurrence as an initial PSA value 0.2 ng/mL or greater followed by a subsequent confirmatory PSA value 0.2 ng/mL or greater. Values obtained with different assay methods or kits cannot be used interchangeably. Results cannot be interpreted as absolute evidence of the presence or absence of malignant disease.    PSA, Free 0.54 N/A ng/mL    Comment: Roche ECLIA methodology.   PSA, Free Pct 20.8 %    Comment: The table below lists the probability of prostate cancer for men with non-suspicious DRE results and total PSA between 4 and 10 ng/mL, by patient age Ricci Barker, Seltzer, [id number]).                   % Free PSA       50-64 yr        65-75 yr                   0.00-10.00%        56%             55%                  10.01-15.00%        24%             35%                  15.01-20.00%        17%             23%                  20.01-25.00%        10%  20%                       >25.00%         5%              9% Please note:  Catalona et al did not make specific               recommendations regarding the use of               percent  free PSA for any other population               of men.   Magnesium     Status: Abnormal   Collection Time: 09/10/21 12:01 PM  Result Value Ref Range   Magnesium 1.2 (L) 1.6 - 2.3 mg/dL  Anemia Profile B     Status: Abnormal   Collection Time: 09/10/21 12:01 PM  Result Value Ref Range   Total Iron Binding Capacity 351 250 - 450 ug/dL   UIBC 84 (L) 111 - 343 ug/dL   Iron 267 (HH) 38 - 169 ug/dL   Iron Saturation 76 (HH) 15 - 55 %   Ferritin 414 (H) 30 - 400 ng/mL   Vitamin B-12 476 232 - 1,245 pg/mL   Folate 6.1 >3.0 ng/mL    Comment: A serum folate concentration of less than 3.1 ng/mL is considered to represent clinical deficiency.    WBC 3.6 3.4 - 10.8 x10E3/uL   RBC 4.33 4.14 - 5.80 x10E6/uL   Hemoglobin 14.4 13.0 - 17.7 g/dL   Hematocrit 42.4 37.5 - 51.0 %   MCV 98 (H) 79 - 97 fL   MCH 33.3 (H) 26.6 - 33.0 pg   MCHC 34.0 31.5 - 35.7 g/dL   RDW 12.1 11.6 - 15.4 %   Platelets 137 (L) 150 - 450 x10E3/uL   Neutrophils 50 Not Estab. %   Lymphs 42 Not Estab. %   Monocytes 6 Not Estab. %   Eos 1 Not Estab. %   Basos 1 Not Estab. %   Neutrophils Absolute 1.8 1.4 - 7.0 x10E3/uL   Lymphocytes Absolute 1.5 0.7 - 3.1 x10E3/uL   Monocytes Absolute 0.2 0.1 - 0.9 x10E3/uL   EOS (ABSOLUTE) 0.0 0.0 - 0.4 x10E3/uL   Basophils Absolute 0.0 0.0 - 0.2 x10E3/uL   Immature Granulocytes 0 Not Estab. %   Immature Grans (Abs) 0.0 0.0 - 0.1 x10E3/uL   Retic Ct Pct 1.9 0.6 - 2.6 %  VITAMIN D 25 Hydroxy (Vit-D Deficiency, Fractures)     Status: Abnormal   Collection Time: 09/10/21 12:01 PM  Result Value Ref Range   Vit D, 25-Hydroxy 25.0 (L) 30.0 - 100.0 ng/mL    Comment: Vitamin D deficiency has been defined by the Institute of Medicine and an Endocrine Society practice guideline as a level of serum 25-OH vitamin D less than 20 ng/mL (1,2). The Endocrine Society went on to further define vitamin D insufficiency as a level between 21 and 29 ng/mL (2). 1. IOM (Institute of Medicine). 2010.  Dietary reference    intakes for calcium and D. [place]: The    [employer]. 2. Holick MF, Binkley Mercer, Bischoff-Ferrari HA, et al.    Evaluation, treatment, and prevention of vitamin D    deficiency: an Endocrine Society clinical practice    guideline. JCEM. 2011 Jul; 96(7):1911-30.     RADIOGRAPHIC STUDIES: I have personally reviewed the radiological images as listed  and agreed with the findings in the report. DG [facility] DEXA  Result Date: 09/12/2021 CLINICAL DATA:  66 year old Caucasian male. Personal history of multiple fractures as an adult, including the LEFT hip, RIGHT humeral head, and LEFT wrist (distal radius). Personal history of LEFT hip arthroplasty. Current smoker. Baseline examination. EXAM: DUAL X-RAY ABSORPTIOMETRY (DXA) FOR BONE MINERAL DENSITY TECHNIQUE: Bone mineral density measurements are performed of the spine, hip, and forearm, as appropriate, per International Society of Clinical Densitometry recommendations. The pertinent regions of interest are reported below. Non-contributory values are not reported. Images are obtained for bone mineral density measurement and are not obtained for diagnostic purposes. FINDINGS: AP LUMBAR SPINE (L1, L3, L4) Bone Mineral Density (BMD):  1.204 g/cm2 Young Adult T-Score:  0.1 Z-Score:  0.9 RIGHT FEMUR TOTAL Bone Mineral Density (BMD):  0.820 g/cm2 Young Adult T-Score: -1.5 Z-Score:  -1.0 Unit: This study was performed at [facility] [place] [facility] on a [place]. Scan quality: The scan quality is good. Exclusions: L2 (due to locally advanced degenerative changes) and the LEFT hip (due to the prior arthroplasty). ASSESSMENT: Patient's diagnostic category is LOW BONE MASS/OSTEOPENIA by [facility] Criteria. FRACTURE RISK:  INCREASED. FRAX: World Health Organization FRAX assessment of absolute fracture risk is not calculated for this patient because the patient has a personal history LEFT hip fracture. COMPARISON: None.  RECOMMENDATIONS: 1. All patients should optimize calcium and vitamin D intake. 2. Consider FDA-approved medical therapies in postmenopausal women and men aged 23 years and older, based on the following: 1. A hip or vertebral (clinical or morphometric) fracture 2. T-score ? -2.5 at the femoral neck or spine after appropriate evaluation to exclude secondary causes 3. Low bone mass (T-score between -1.0 and -2.5 at the femoral neck or spine) and a 10-year probability of a hip fracture ? 3% or a 10-year probability of a major osteoporosis-related fracture ? 20% based on the [place]-adapted WHO algorithm 4. Clinician judgment and/or patient preferences may indicate treatment for people with 10-year fracture probabilities above or below these levels 3. Patients with diagnosis of osteoporosis or at high risk for fracture should have regular bone mineral density tests.? For patients eligible for Medicare, routine testing is allowed once every 2 years.? The testing frequency can be increased to one year for patients who have rapidly progressing disease, those who are receiving or discontinuing medical therapy to restore bone mass, or have additional risk factors. Electronically Signed   By: Evangeline Dakin M.D.   On: 09/12/2021 16:24   US SCROTUM W/DOPPLER  Result Date: 09/12/2021 CLINICAL DATA:  Left scrotal swelling.  Worse over the past 2 months EXAM: SCROTAL ULTRASOUND DOPPLER ULTRASOUND OF THE TESTICLES TECHNIQUE: Complete ultrasound examination of the testicles, epididymis, and other scrotal structures was performed. Color and spectral Doppler ultrasound were also utilized to evaluate blood flow to the testicles. COMPARISON:  None FINDINGS: Right testicle Measurements: 4.3 x 1.8 x 2.3 cm. No mass or microlithiasis visualized. Left testicle Measurements: 4.1 x 1.7 x 2.8 cm. No mass or microlithiasis visualized. Right epididymis:  Normal in size and appearance. Left epididymis:  Small cyst is noted measuring 2 x 3 mm.  Hydrocele: Small bilateral hydroceles noted, right greater than left. Varicocele:  None visualized. Pulsed Doppler interrogation of both testes demonstrates normal low resistance arterial and venous waveforms bilaterally. Other: Incidental finding a left groin hernia which contains a nonobstructed appearing loop of bowel. IMPRESSION: 1. No evidence for testicular mass or torsion. 2. Small bilateral hydroceles. 3. Left groin  hernia containing a loop of bowel. Electronically Signed   By: Kerby Moors M.D.   On: 09/12/2021 12:20    ASSESSMENT:  Thrombocytopenia: - Patient seen at the request of Marjorie Smolder, FNP for mild thrombocytopenia. - CBC on 09/10/2021 showed platelet count 137 with normal hemoglobin, slight macrocytosis and mild leukopenia. - He reports history of hepatitis C, which was never treated.  No prior history of blood transfusion. - No prior history of excessive bleeding after dental extractions are previous hip surgery. - Right upper quadrant ultrasound on 10/15/2020 showed cirrhotic morphology of the liver with no focal liver lesions.   Social/family history: - He lives at home by himself.  He is on disability for few years.  He is retired Scientist, product/process development.  He is current active smoker, 1 pack/day for 40 years. - Mother had "bone cancer".  Maternal uncle died of cancer.  Brother had lung cancer.   PLAN:  Thrombocytopenia: - Differential diagnosis includes mild thrombocytopenia in the setting of cirrhosis/liver disease with possible splenomegaly.  Clinically there was no palpable splenomegaly. - Recommend ultrasound spleen for evaluation of spleen size. - Will check CBC with differential and rule out nutritional deficiencies and infectious etiologies. - We will also check for flow cytometry as he has intermittent thrombocytopenia and leukopenia. - RTC 2 to 3 weeks to discuss results and further plan.  2.  Smoking history: - He he is a current active smoker 1 pack/day for more than  40 years. - We discussed the role of low-dose CT scan of the chest for lung cancer screening protocol.  He is agreeable.  We will place an order.   All questions were answered. The patient knows to call the clinic with any problems, questions or concerns.  Derek Jack, MD 09/18/21 10:17 AM  [place] [phone]   I, Thana Ates, am acting as a scribe for Dr. Derek Jack.  I, Derek Jack MD, have reviewed the above documentation for accuracy and completeness, and I agree with the above.

## 2021-09-18 ENCOUNTER — Inpatient Hospital Stay (HOSPITAL_COMMUNITY): Payer: Medicare Other

## 2021-09-18 ENCOUNTER — Other Ambulatory Visit: Payer: Self-pay

## 2021-09-18 ENCOUNTER — Encounter (HOSPITAL_COMMUNITY): Payer: Self-pay | Admitting: Hematology

## 2021-09-18 ENCOUNTER — Inpatient Hospital Stay (HOSPITAL_COMMUNITY): Payer: Medicare Other | Attending: Hematology | Admitting: Hematology

## 2021-09-18 VITALS — BP 151/89 | HR 73 | Temp 98.7°F | Resp 18 | Ht 65.5 in | Wt 129.1 lb

## 2021-09-18 DIAGNOSIS — D696 Thrombocytopenia, unspecified: Secondary | ICD-10-CM | POA: Diagnosis not present

## 2021-09-18 DIAGNOSIS — B1920 Unspecified viral hepatitis C without hepatic coma: Secondary | ICD-10-CM | POA: Diagnosis not present

## 2021-09-18 DIAGNOSIS — D72819 Decreased white blood cell count, unspecified: Secondary | ICD-10-CM | POA: Diagnosis not present

## 2021-09-18 DIAGNOSIS — Z801 Family history of malignant neoplasm of trachea, bronchus and lung: Secondary | ICD-10-CM | POA: Diagnosis not present

## 2021-09-18 DIAGNOSIS — Z808 Family history of malignant neoplasm of other organs or systems: Secondary | ICD-10-CM | POA: Insufficient documentation

## 2021-09-18 DIAGNOSIS — F17210 Nicotine dependence, cigarettes, uncomplicated: Secondary | ICD-10-CM | POA: Diagnosis not present

## 2021-09-18 LAB — CBC WITH DIFFERENTIAL/PLATELET
Abs Immature Granulocytes: 0.01 10*3/uL (ref 0.00–0.07)
Basophils Absolute: 0 10*3/uL (ref 0.0–0.1)
Basophils Relative: 1 %
Eosinophils Absolute: 0.1 10*3/uL (ref 0.0–0.5)
Eosinophils Relative: 2 %
HCT: 41 % (ref 39.0–52.0)
Hemoglobin: 13.9 g/dL (ref 13.0–17.0)
Immature Granulocytes: 0 %
Lymphocytes Relative: 45 %
Lymphs Abs: 1.5 10*3/uL (ref 0.7–4.0)
MCH: 34.2 pg — ABNORMAL HIGH (ref 26.0–34.0)
MCHC: 33.9 g/dL (ref 30.0–36.0)
MCV: 100.7 fL — ABNORMAL HIGH (ref 80.0–100.0)
Monocytes Absolute: 0.3 10*3/uL (ref 0.1–1.0)
Monocytes Relative: 10 %
Neutro Abs: 1.4 10*3/uL — ABNORMAL LOW (ref 1.7–7.7)
Neutrophils Relative %: 42 %
Platelets: 151 10*3/uL (ref 150–400)
RBC: 4.07 MIL/uL — ABNORMAL LOW (ref 4.22–5.81)
RDW: 12.7 % (ref 11.5–15.5)
WBC: 3.3 10*3/uL — ABNORMAL LOW (ref 4.0–10.5)
nRBC: 0 % (ref 0.0–0.2)

## 2021-09-18 LAB — HEPATITIS C ANTIBODY: HCV Ab: REACTIVE — AB

## 2021-09-18 LAB — VITAMIN B12: Vitamin B-12: 350 pg/mL (ref 180–914)

## 2021-09-18 LAB — RETICULOCYTES
Immature Retic Fract: 3.9 % (ref 2.3–15.9)
RBC.: 4.19 MIL/uL — ABNORMAL LOW (ref 4.22–5.81)
Retic Count, Absolute: 72.9 10*3/uL (ref 19.0–186.0)
Retic Ct Pct: 1.7 % (ref 0.4–3.1)

## 2021-09-18 LAB — HEPATITIS B SURFACE ANTIBODY,QUALITATIVE: Hep B S Ab: NONREACTIVE

## 2021-09-18 LAB — LACTATE DEHYDROGENASE: LDH: 177 U/L (ref 98–192)

## 2021-09-18 LAB — HEPATITIS B SURFACE ANTIGEN: Hepatitis B Surface Ag: NONREACTIVE

## 2021-09-18 LAB — FOLATE: Folate: 8.6 ng/mL (ref >5.9)

## 2021-09-18 NOTE — Patient Instructions (Addendum)
[place] at [facility] Discharge Instructions   You were seen and examined today by Dr. Delton Coombes.  We will do extensive blood tests to see what may be causing your low platelet count.  Return as scheduled in 2 weeks to review lab results.      Thank you for choosing [place] at [facility] to provide your oncology and hematology care.  To afford each patient quality time with our provider, please arrive at least 15 minutes before your scheduled appointment time.   If you have a lab appointment with the [facility] please come in thru the Main Entrance and check in at the main information desk.  You need to re-schedule your appointment should you arrive 10 or more minutes late.  We strive to give you quality time with our providers, and arriving late affects you and other patients whose appointments are after yours.  Also, if you no show three or more times for appointments you may be dismissed from the clinic at the providers discretion.     Again, thank you for choosing [facility].  Our hope is that these requests will decrease the amount of time that you wait before being seen by our physicians.       _____________________________________________________________  Should you have questions after your visit to [facility], please contact our office at [phone] and follow the prompts.  Our office hours are 8:00 a.m. and 4:30 p.m. Monday - Friday.  Please note that voicemails left after 4:00 p.m. may not be returned until the following business day.  We are closed weekends and major holidays.  You do have access to a nurse 24-7, just call the main number to the clinic [phone] and do not press any options, hold on the line and a nurse will answer the phone.    For prescription refill requests, have your pharmacy contact our office and allow 72 hours.    Due to Covid, you will need to wear a mask upon  entering the hospital. If you do not have a mask, a mask will be given to you at the Main Entrance upon arrival. For doctor visits, patients may have 1 support person age 33 or older with them. For treatment visits, patients can not have anyone with them due to social distancing guidelines and our immunocompromised population.

## 2021-09-19 LAB — MISC LABCORP TEST (SEND OUT): Labcorp test code: 160101

## 2021-09-19 LAB — H PYLORI, IGM, IGG, IGA AB
H Pylori IgG: 0.22 Index Value (ref 0.00–0.79)
H. Pylogi, Iga Abs: <9 units (ref 0.0–8.9)
H. Pylogi, Igm Abs: <9 units (ref 0.0–8.9)

## 2021-09-21 LAB — METHYLMALONIC ACID, SERUM: Methylmalonic Acid, Quantitative: 246 nmol/L (ref 0–378)

## 2021-09-22 LAB — PROTEIN ELECTROPHORESIS, SERUM
A/G Ratio: 0.9 (ref 0.7–1.7)
Albumin ELP: 3.6 g/dL (ref 2.9–4.4)
Alpha-1-Globulin: 0.2 g/dL (ref 0.0–0.4)
Alpha-2-Globulin: 0.6 g/dL (ref 0.4–1.0)
Beta Globulin: 0.9 g/dL (ref 0.7–1.3)
Gamma Globulin: 2.5 g/dL — ABNORMAL HIGH (ref 0.4–1.8)
Globulin, Total: 4.2 g/dL — ABNORMAL HIGH (ref 2.2–3.9)
Total Protein ELP: 7.8 g/dL (ref 6.0–8.5)

## 2021-09-22 LAB — COPPER, SERUM: Copper: 124 ug/dL (ref 69–132)

## 2021-09-22 LAB — SURGICAL PATHOLOGY

## 2021-10-06 ENCOUNTER — Other Ambulatory Visit: Payer: Self-pay

## 2021-10-06 ENCOUNTER — Ambulatory Visit (HOSPITAL_COMMUNITY)
Admission: RE | Admit: 2021-10-06 | Discharge: 2021-10-06 | Disposition: A | Discharge status: Home / Self Care | Payer: Medicare Other | Source: Ambulatory Visit | Attending: Hematology | Admitting: Hematology

## 2021-10-06 DIAGNOSIS — D696 Thrombocytopenia, unspecified: Secondary | ICD-10-CM | POA: Insufficient documentation

## 2021-10-08 ENCOUNTER — Other Ambulatory Visit: Payer: Self-pay

## 2021-10-08 ENCOUNTER — Inpatient Hospital Stay (HOSPITAL_BASED_OUTPATIENT_CLINIC_OR_DEPARTMENT_OTHER): Payer: Medicare Other | Admitting: Hematology

## 2021-10-08 VITALS — BP 141/84 | HR 69 | Temp 96.4°F | Resp 18 | Ht 66.14 in | Wt 127.4 lb

## 2021-10-08 DIAGNOSIS — D696 Thrombocytopenia, unspecified: Secondary | ICD-10-CM | POA: Diagnosis not present

## 2021-10-08 DIAGNOSIS — D72819 Decreased white blood cell count, unspecified: Secondary | ICD-10-CM | POA: Diagnosis not present

## 2021-10-08 DIAGNOSIS — F17210 Nicotine dependence, cigarettes, uncomplicated: Secondary | ICD-10-CM | POA: Diagnosis not present

## 2021-10-08 DIAGNOSIS — Z808 Family history of malignant neoplasm of other organs or systems: Secondary | ICD-10-CM | POA: Diagnosis not present

## 2021-10-08 DIAGNOSIS — B1920 Unspecified viral hepatitis C without hepatic coma: Secondary | ICD-10-CM

## 2021-10-08 DIAGNOSIS — Z801 Family history of malignant neoplasm of trachea, bronchus and lung: Secondary | ICD-10-CM | POA: Diagnosis not present

## 2021-10-08 NOTE — Progress Notes (Signed)
Kyle Hammond, [place] [address]   CLINIC:  Medical Oncology/Hematology  PCP:  Gwenlyn Perking, [place] / [place] [address]  [phone]  REASON FOR VISIT:  Follow-up for thrombocytopenia  PRIOR THERAPY: none  CURRENT THERAPY: surveillance  INTERVAL HISTORY:  Kyle Hammond, a 66 y.o. male, returns for routine follow-up for his thrombocytopenia. Kyle Hammond was last seen on 09/18/2020.  Today he reports feeling good. He is to be seen at [facility] for treatment of his Hepatitis C in April.   REVIEW OF SYSTEMS:  Review of Systems  Constitutional:  Negative for appetite change and fatigue.  All other systems reviewed and are negative.  PAST MEDICAL/SURGICAL HISTORY:  Past Medical History:  Diagnosis Date   Cataract    Hepatitis C    Past Surgical History:  Procedure Laterality Date   CATARACT EXTRACTION, BILATERAL  2019   HIP FRACTURE SURGERY Left 04/2016    SOCIAL HISTORY:  Social History   Socioeconomic History   Marital status: Divorced    Spouse name: Not on file   Number of children: 2   Years of education: 12   Highest education level: High school graduate  Occupational History   Occupation: disabled  Tobacco Use   Smoking status: Every Day    Packs/day: 1.00    Years: 50.00    Pack years: 50.00    Types: Cigarettes   Smokeless tobacco: Never  Vaping Use   Vaping Use: Never used  Substance and Sexual Activity   Alcohol use: Yes    Alcohol/week: 12.0 standard drinks    Types: 12 Cans of beer per week   Drug use: No   Sexual activity: Not Currently  Other Topics Concern   Not on file  Social History Narrative   Not on file   Social Determinants of Health   Financial Resource Strain: Not on file  Food Insecurity: Not on file  Transportation Needs: Not on file  Physical Activity: Not on file  Stress: Not on file  Social Connections: Not on file  Intimate Partner Violence: Not  on file    FAMILY HISTORY:  Family History  Problem Relation Age of Onset   Cancer Mother        bone   Heart disease Father    Heart attack Father     CURRENT MEDICATIONS:  No current outpatient medications on file.   No current facility-administered medications for this visit.    ALLERGIES:  No Known Allergies  PHYSICAL EXAM:  Performance status (ECOG): 1 - Symptomatic but completely ambulatory  Vitals:   10/08/21 1030  BP: (!) 141/84  Pulse: 69  Resp: 18  Temp: (!) 96.4 F (35.8 C)  SpO2: 97%   Wt Readings from Last 3 Encounters:  10/08/21 127 lb 6.8 oz (57.8 kg)  09/18/21 129 lb 1.6 oz (58.6 kg)  09/10/21 130 lb 2 oz (59 kg)   Physical Exam Vitals reviewed.  Constitutional:      Appearance: Normal appearance.  Cardiovascular:     Rate and Rhythm: Normal rate and regular rhythm.     Pulses: Normal pulses.     Heart sounds: Normal heart sounds.  Pulmonary:     Effort: Pulmonary effort is normal.     Breath sounds: Normal breath sounds.  Neurological:     General: No focal deficit present.     Mental Status: He is alert and oriented to person, place, and  time.  Psychiatric:        Mood and Affect: Mood normal.        Behavior: Behavior normal.    LABORATORY DATA:  I have reviewed the labs as listed.  CBC Latest Ref Rng & Units 09/18/2021 09/10/2021 03/10/2021  WBC 4.0 - 10.5 K/uL 3.3(L) 3.6 2.9(L)  Hemoglobin 13.0 - 17.0 g/dL 13.9 14.4 12.9(L)  Hematocrit 39.0 - 52.0 % 41.0 42.4 36.7(L)  Platelets 150 - 400 K/uL 151 137(L) 162   CMP Latest Ref Rng & Units 09/10/2021 03/10/2021 11/29/2020  Glucose 70 - 99 mg/dL 71 118(H) 88  BUN 8 - 27 mg/dL 10 9 10   Creatinine 0.76 - 1.27 mg/dL 0.76 0.70(L) 0.71(L)  Sodium 134 - 144 mmol/L 137 138 139  Potassium 3.5 - 5.2 mmol/L 3.6 3.8 4.4  Chloride 96 - 106 mmol/L 98 102 101  CO2 20 - 29 mmol/L 26 22 23   Calcium 8.6 - 10.2 mg/dL 9.0 8.1(L) 8.7  Total Protein 6.0 - 8.5 g/dL 8.3 7.7 8.3  Total Bilirubin 0.0 - 1.2  mg/dL 1.3(H) 0.7 1.3(H)  Alkaline Phos 44 - 121 IU/L 79 68 77  AST 0 - 40 IU/L 173(H) 184(H) 113(H)  ALT 0 - 44 IU/L 115(H) 108(H) 81(H)      Component Value Date/Time   RBC 4.19 (L) 09/18/2021 1103   RBC 4.07 (L) 09/18/2021 1101   MCV 100.7 (H) 09/18/2021 1101   MCV 98 (H) 09/10/2021 1201   MCH 34.2 (H) 09/18/2021 1101   MCHC 33.9 09/18/2021 1101   RDW 12.7 09/18/2021 1101   RDW 12.1 09/10/2021 1201   LYMPHSABS 1.5 09/18/2021 1101   LYMPHSABS 1.5 09/10/2021 1201   MONOABS 0.3 09/18/2021 1101   EOSABS 0.1 09/18/2021 1101   EOSABS 0.0 09/10/2021 1201   BASOSABS 0.0 09/18/2021 1101   BASOSABS 0.0 09/10/2021 1201    DIAGNOSTIC IMAGING:  I have independently reviewed the scans and discussed with the patient. US Abdomen Limited  Result Date: 10/06/2021 CLINICAL DATA:  Thrombocytopenia EXAM: ULTRASOUND ABDOMEN LIMITED COMPARISON:  None FINDINGS: Targeted ultrasound of the left upper quadrant/spleen is performed. Splenic length of 7.7 cm with volume of 154.2 mL. Negative for mass IMPRESSION: Ultrasound appearance of the spleen is within normal limits Electronically Signed   By: Donavan Foil M.D.   On: 10/06/2021 16:36   DG [facility] DEXA  Result Date: 09/12/2021 CLINICAL DATA:  66 year old Caucasian male. Personal history of multiple fractures as an adult, including the LEFT hip, RIGHT humeral head, and LEFT wrist (distal radius). Personal history of LEFT hip arthroplasty. Current smoker. Baseline examination. EXAM: DUAL X-RAY ABSORPTIOMETRY (DXA) FOR BONE MINERAL DENSITY TECHNIQUE: Bone mineral density measurements are performed of the spine, hip, and forearm, as appropriate, per International Society of Clinical Densitometry recommendations. The pertinent regions of interest are reported below. Non-contributory values are not reported. Images are obtained for bone mineral density measurement and are not obtained for diagnostic purposes. FINDINGS: AP LUMBAR SPINE (L1, L3, L4) Bone Mineral  Density (BMD):  1.204 g/cm2 Young Adult T-Score:  0.1 Z-Score:  0.9 RIGHT FEMUR TOTAL Bone Mineral Density (BMD):  0.820 g/cm2 Young Adult T-Score: -1.5 Z-Score:  -1.0 Unit: This study was performed at [facility] on a [place]. Scan quality: The scan quality is good. Exclusions: L2 (due to locally advanced degenerative changes) and the LEFT hip (due to the prior arthroplasty). ASSESSMENT: Patient's diagnostic category is LOW BONE MASS/OSTEOPENIA by [facility] Criteria. FRACTURE RISK:  INCREASED. FRAX:  World Health Organization FRAX assessment of absolute fracture risk is not calculated for this patient because the patient has a personal history LEFT hip fracture. COMPARISON: None. RECOMMENDATIONS: 1. All patients should optimize calcium and vitamin D intake. 2. Consider FDA-approved medical therapies in postmenopausal women and men aged 79 years and older, based on the following: 1. A hip or vertebral (clinical or morphometric) fracture 2. T-score ? -2.5 at the femoral neck or spine after appropriate evaluation to exclude secondary causes 3. Low bone mass (T-score between -1.0 and -2.5 at the femoral neck or spine) and a 10-year probability of a hip fracture ? 3% or a 10-year probability of a major osteoporosis-related fracture ? 20% based on the [place]-adapted WHO algorithm 4. Clinician judgment and/or patient preferences may indicate treatment for people with 10-year fracture probabilities above or below these levels 3. Patients with diagnosis of osteoporosis or at high risk for fracture should have regular bone mineral density tests.? For patients eligible for Medicare, routine testing is allowed once every 2 years.? The testing frequency can be increased to one year for patients who have rapidly progressing disease, those who are receiving or discontinuing medical therapy to restore bone mass, or have additional risk factors. Electronically Signed   By: Evangeline Dakin M.D.   On:  09/12/2021 16:24   US SCROTUM W/DOPPLER  Result Date: 09/12/2021 CLINICAL DATA:  Left scrotal swelling.  Worse over the past 2 months EXAM: SCROTAL ULTRASOUND DOPPLER ULTRASOUND OF THE TESTICLES TECHNIQUE: Complete ultrasound examination of the testicles, epididymis, and other scrotal structures was performed. Color and spectral Doppler ultrasound were also utilized to evaluate blood flow to the testicles. COMPARISON:  None FINDINGS: Right testicle Measurements: 4.3 x 1.8 x 2.3 cm. No mass or microlithiasis visualized. Left testicle Measurements: 4.1 x 1.7 x 2.8 cm. No mass or microlithiasis visualized. Right epididymis:  Normal in size and appearance. Left epididymis:  Small cyst is noted measuring 2 x 3 mm. Hydrocele: Small bilateral hydroceles noted, right greater than left. Varicocele:  None visualized. Pulsed Doppler interrogation of both testes demonstrates normal low resistance arterial and venous waveforms bilaterally. Other: Incidental finding a left groin hernia which contains a nonobstructed appearing loop of bowel. IMPRESSION: 1. No evidence for testicular mass or torsion. 2. Small bilateral hydroceles. 3. Left groin hernia containing a loop of bowel. Electronically Signed   By: Kerby Moors M.D.   On: 09/12/2021 12:20     ASSESSMENT:  Thrombocytopenia: - Patient seen at the request of Marjorie Smolder, FNP for mild thrombocytopenia. - CBC on 09/10/2021 showed platelet count 137 with normal hemoglobin, slight macrocytosis and mild leukopenia. - He reports history of hepatitis C, which was never treated.  No prior history of blood transfusion. - No prior history of excessive bleeding after dental extractions are previous hip surgery. - Right upper quadrant ultrasound on 10/15/2020 showed cirrhotic morphology of the liver with no focal liver lesions.    Social/family history: - He lives at home by himself.  He is on disability for few years.  He is retired Scientist, product/process development.  He is current active  smoker, 1 pack/day for 40 years. - Mother had "bone cancer".  Maternal uncle died of cancer.  Brother had lung cancer.   PLAN:  Thrombocytopenia: - Ultrasound of the abdomen on 10/06/2021 with spleen size 7.7 cm and volume 154 mL. - Hepatitis B and H. pylori were negative.  Hepatitis C antibody was positive.  Repeat platelet count was 151 and normal range.  SPEP  is negative.  Nutritional deficiency work-up was also negative. - It is likely that he has mild thrombocytopenia from hepatitis C. - Flow cytometry showed gamma delta T cells which are nonspecific. - Recommend follow-up in 6 months with repeat CBC.  2.  Smoking history: - He is a current active smoker 1 pack/day for more than 40 years. - We discussed role of low-dose CT scan of the chest for lung cancer screening protocol.  We will follow-up on it at next visit.  3.  Hepatitis C: - He has follow-up with GI for discussing treatment options.  Orders placed this encounter:  No orders of the defined types were placed in this encounter.    Derek Jack, MD [place] [phone]   I, Thana Ates, am acting as a scribe for Dr. Derek Jack.  I, Derek Jack MD, have reviewed the above documentation for accuracy and completeness, and I agree with the above.

## 2021-10-10 LAB — FLOW CYTOMETRY

## 2021-10-20 NOTE — Progress Notes (Signed)
Results reviewed with patient at 1/25 visit by Dr. Delton Coombes.

## 2021-11-14 ENCOUNTER — Telehealth: Payer: Self-pay | Admitting: Family Medicine

## 2021-11-14 NOTE — Telephone Encounter (Signed)
If he needs home health he will need a face-to-face visit. ?

## 2021-11-17 NOTE — Telephone Encounter (Signed)
Na

## 2021-11-24 NOTE — Telephone Encounter (Signed)
Attempted to contact- NA ?This encounter will be closed  ?

## 2021-12-10 ENCOUNTER — Encounter: Payer: Self-pay | Admitting: Family Medicine

## 2021-12-10 ENCOUNTER — Ambulatory Visit (INDEPENDENT_AMBULATORY_CARE_PROVIDER_SITE_OTHER): Payer: Medicare Other | Admitting: Family Medicine

## 2021-12-10 VITALS — BP 140/74 | HR 80 | Temp 97.2°F | Ht 66.0 in | Wt 125.2 lb

## 2021-12-10 DIAGNOSIS — Z23 Encounter for immunization: Secondary | ICD-10-CM | POA: Diagnosis not present

## 2021-12-10 DIAGNOSIS — B182 Chronic viral hepatitis C: Secondary | ICD-10-CM

## 2021-12-10 DIAGNOSIS — Z720 Tobacco use: Secondary | ICD-10-CM | POA: Diagnosis not present

## 2021-12-10 DIAGNOSIS — F1020 Alcohol dependence, uncomplicated: Secondary | ICD-10-CM

## 2021-12-10 DIAGNOSIS — D696 Thrombocytopenia, unspecified: Secondary | ICD-10-CM

## 2021-12-10 DIAGNOSIS — I10 Essential (primary) hypertension: Secondary | ICD-10-CM | POA: Diagnosis not present

## 2021-12-10 DIAGNOSIS — E785 Hyperlipidemia, unspecified: Secondary | ICD-10-CM | POA: Insufficient documentation

## 2021-12-10 DIAGNOSIS — R790 Abnormal level of blood mineral: Secondary | ICD-10-CM

## 2021-12-10 DIAGNOSIS — E559 Vitamin D deficiency, unspecified: Secondary | ICD-10-CM | POA: Diagnosis not present

## 2021-12-10 MED ORDER — AMLODIPINE BESYLATE 2.5 MG PO TABS: 2.5000 mg | ORAL_TABLET | Freq: Every day | ORAL | 3 refills | Status: DC

## 2021-12-10 NOTE — Patient Instructions (Signed)

## 2021-12-10 NOTE — Progress Notes (Signed)
? ?Established Patient Office Visit ? ?Subjective:  ?Patient ID: Kyle Hammond, male    DOB: 1955-10-18  Age: 66 y.o. MRN: [id number] ? ?CC:  ?Chief Complaint  ?Patient presents with  ? Medical Management of Chronic Issues  ? ? ?HPI ?Kyle Hammond presents for chronic follow up. He reports doing well, feeling great, and has no concerns today.  ? ?He has seen oncology for thrombocytopenia. This is though to be due to his chronic hep C infection. He will see GI next month for his Hep C infection. He has not been taking vitamin D, calcium, or magnesium supplement. He continues to drink a few beers a day.  ? ? ?  12/10/2021  ? 11:10 AM 03/10/2021  ?  8:20 AM 11/29/2020  ?  9:48 AM  ?Depression screen PHQ 2/9  ?Decreased Interest 0 0 0  ?Down, Depressed, Hopeless 0 0 0  ?PHQ - 2 Score 0 0 0  ?Altered sleeping 0    ?Tired, decreased energy 0    ?Change in appetite 0    ?Feeling bad or failure about yourself  0    ?Trouble concentrating 0    ?Moving slowly or fidgety/restless 0    ?Suicidal thoughts 0    ?PHQ-9 Score 0    ?Difficult doing work/chores Not difficult at all    ? ?generalized anxiety disorder ? ?Past Medical History:  ?Diagnosis Date  ? Cataract   ? Hepatitis C   ? ? ?Past Surgical History:  ?Procedure Laterality Date  ? CATARACT EXTRACTION, BILATERAL  2019  ? HIP FRACTURE SURGERY Left 04/2016  ? ? ?Family History  ?Problem Relation Age of Onset  ? Cancer Mother   ?     bone  ? Heart disease Father   ? Heart attack Father   ? ? ?Social History  ? ?Socioeconomic History  ? Marital status: Divorced  ?  Spouse name: Not on file  ? Number of children: 2  ? Years of education: 19  ? Highest education level: High school graduate  ?Occupational History  ? Occupation: disabled  ?Tobacco Use  ? Smoking status: Every Day  ?  Packs/day: 1.00  ?  Years: 50.00  ?  Pack years: 50.00  ?  Types: Cigarettes  ? Smokeless tobacco: Never  ?Vaping Use  ? Vaping Use: Never used  ?Substance and Sexual Activity  ? Alcohol use: Yes  ?   Alcohol/week: 12.0 standard drinks  ?  Types: 12 Cans of beer per week  ? Drug use: No  ? Sexual activity: Not Currently  ?Other Topics Concern  ? Not on file  ?Social History Narrative  ? Not on file  ? ?Social Determinants of Health  ? ?Financial Resource Strain: Not on file  ?Food Insecurity: Not on file  ?Transportation Needs: Not on file  ?Physical Activity: Not on file  ?Stress: Not on file  ?Social Connections: Not on file  ?Intimate Partner Violence: Not on file  ? ? ?No outpatient medications prior to visit.  ? ?No facility-administered medications prior to visit.  ? ? ?No Known Allergies ? ?ROS ?Review of Systems ?As per HPI.  ?  ?Objective:  ?  ?Physical Exam ?Vitals and nursing note reviewed.  ?Constitutional:   ?   General: He is not in acute distress. ?   Appearance: He is not ill-appearing, toxic-appearing or diaphoretic.  ?HENT:  ?   Head: Normocephalic and atraumatic.  ?   Nose: Nose normal.  ?  Mouth/Throat:  ?   Mouth: Mucous membranes are moist.  ?   Dentition: Abnormal dentition (poor dentition). Dental caries present. No dental abscesses or gum lesions.  ?   Pharynx: Oropharynx is clear. No oropharyngeal exudate or posterior oropharyngeal erythema.  ?Eyes:  ?   Conjunctiva/sclera: Conjunctivae normal.  ?   Pupils: Pupils are equal, round, and reactive to light.  ?Neck:  ?   Thyroid: No thyroid mass, thyromegaly or thyroid tenderness.  ?Cardiovascular:  ?   Rate and Rhythm: Normal rate and regular rhythm.  ?   Heart sounds: Normal heart sounds. No murmur heard. ?Pulmonary:  ?   Effort: Pulmonary effort is normal.  ?   Breath sounds: Normal breath sounds.  ?Abdominal:  ?   General: Bowel sounds are normal. There is no distension.  ?   Palpations: Abdomen is soft.  ?   Tenderness: There is no abdominal tenderness. There is no guarding or rebound.  ?Musculoskeletal:  ?   Cervical back: Neck supple.  ?   Right lower leg: No edema.  ?   Left lower leg: No edema.  ?Skin: ?   General: Skin is warm and  dry.  ?Neurological:  ?   Mental Status: He is alert and oriented to person, place, and time. Mental status is at baseline.  ?Psychiatric:     ?   Mood and Affect: Mood normal.     ?   Behavior: Behavior normal.     ?   Thought Content: Thought content normal.     ?   Judgment: Judgment normal.  ? ? ?BP 140/74   Pulse 80   Temp (!) 97.2 ?F (36.2 ?C) (Temporal)   Ht '5\' 6"'  (1.676 m)   Wt 125 lb 4 oz (56.8 kg)   BMI 20.22 kg/m?  ?Wt Readings from Last 3 Encounters:  ?12/10/21 125 lb 4 oz (56.8 kg)  ?10/08/21 127 lb 6.8 oz (57.8 kg)  ?09/18/21 129 lb 1.6 oz (58.6 kg)  ? ? ? ?Health Maintenance Due  ?Topic Date Due  ? TETANUS/TDAP  Never done  ? Zoster Vaccines- Shingrix (1 of 2) Never done  ? ? ?There are no preventive care reminders to display for this patient. ? ?Lab Results  ?Component Value Date  ? TSH 2.750 09/10/2021  ? ?Lab Results  ?Component Value Date  ? WBC 3.3 (L) 09/18/2021  ? HGB 13.9 09/18/2021  ? HCT 41.0 09/18/2021  ? MCV 100.7 (H) 09/18/2021  ? PLT 151 09/18/2021  ? ?Lab Results  ?Component Value Date  ? NA 137 09/10/2021  ? K 3.6 09/10/2021  ? CO2 26 09/10/2021  ? GLUCOSE 71 09/10/2021  ? BUN 10 09/10/2021  ? CREATININE 0.76 09/10/2021  ? BILITOT 1.3 (H) 09/10/2021  ? ALKPHOS 79 09/10/2021  ? AST 173 (H) 09/10/2021  ? ALT 115 (H) 09/10/2021  ? PROT 8.3 09/10/2021  ? ALBUMIN 4.3 09/10/2021  ? CALCIUM 9.0 09/10/2021  ? ANIONGAP 4 (L) 06/23/2016  ? EGFR 100 09/10/2021  ? ?Lab Results  ?Component Value Date  ? CHOL 203 (H) 09/10/2021  ? ?Lab Results  ?Component Value Date  ? HDL 106 09/10/2021  ? ?Lab Results  ?Component Value Date  ? [place] 83 09/10/2021  ? ?Lab Results  ?Component Value Date  ? TRIG 77 09/10/2021  ? ?Lab Results  ?Component Value Date  ? CHOLHDL 1.9 09/10/2021  ? ?No results found for: HGBA1C ? ?  ?Assessment & Plan:  ? ?Kyle Hammond was  seen today for medical management of chronic issues. ? ?Diagnoses and all orders for this visit: ? ?Primary hypertension ?Start low dose of amlodipine  today. Asymptomatic. Will refer to CCM today.  ?-     amLODipine (NORVASC) 2.5 MG tablet; Take 1 tablet (2.5 mg total) by mouth daily. ?-     AMB Referral to [facility] ? ?Chronic hepatitis C without hepatic coma (HCC) ?Labs pending. Has appt with GI next month to discuss treatment.  ?-     CBC with Differential/Platelet ?-     CMP14+EGFR ?-     AMB Referral to [facility] ? ?Uncomplicated alcohol dependence ([place]) ?Labs pending.  ?-     CBC with Differential/Platelet ?-     CMP14+EGFR ?-     Thyroid Panel With TSH ?-     Vitamin B12 ?-     AMB Referral to [facility] ? ?Thrombocytopenia ([place]) ?Managed by hematology. Labs pending.  ?-     CBC with Differential/Platelet ? ?Hyperlipidemia, unspecified hyperlipidemia type ?Labs pending. Not on statin.  ?-     Lipid panel ?-     AMB Referral to [facility] ? ?Low magnesium level ?Not on supplement. Labs pending.  ?-     Magnesium ? ?Vitamin D deficiency ?Not on supplement, labs pending.  ?-     VITAMIN D 25 Hydroxy (Vit-D Deficiency, Fractures) ? ?Tobacco use ?Discussed risks and concerns of tobacco use and options for cessation today.  ? ?Need for vaccination ?-     Varicella-zoster vaccine IM (Shingrix) ? ? ?Follow-up: Return in about 2 weeks (around 12/24/2021) for HTN. ? ?The patient indicates understanding of these issues and agrees with the plan.  ? ? ?Gwenlyn Perking, FNP ?

## 2021-12-11 ENCOUNTER — Other Ambulatory Visit: Payer: Self-pay | Admitting: Family Medicine

## 2021-12-11 DIAGNOSIS — E559 Vitamin D deficiency, unspecified: Secondary | ICD-10-CM

## 2021-12-11 LAB — THYROID PANEL WITH TSH
Free Thyroxine Index: 1.8 (ref 1.2–4.9)
T3 Uptake Ratio: 27 % (ref 24–39)
T4, Total: 6.6 ug/dL (ref 4.5–12.0)
TSH: 4.98 u[IU]/mL — ABNORMAL HIGH (ref 0.450–4.500)

## 2021-12-11 LAB — CBC WITH DIFFERENTIAL/PLATELET
Basophils Absolute: 0 10*3/uL (ref 0.0–0.2)
Basos: 1 %
EOS (ABSOLUTE): 0 10*3/uL (ref 0.0–0.4)
Eos: 1 %
Hematocrit: 40.3 % (ref 37.5–51.0)
Hemoglobin: 14.1 g/dL (ref 13.0–17.7)
Immature Grans (Abs): 0 10*3/uL (ref 0.0–0.1)
Immature Granulocytes: 0 %
Lymphocytes Absolute: 0.9 10*3/uL (ref 0.7–3.1)
Lymphs: 52 %
MCH: 34.4 pg — ABNORMAL HIGH (ref 26.6–33.0)
MCHC: 35 g/dL (ref 31.5–35.7)
MCV: 98 fL — ABNORMAL HIGH (ref 79–97)
Monocytes Absolute: 0.1 10*3/uL (ref 0.1–0.9)
Monocytes: 7 %
NRBC: 1 % — ABNORMAL HIGH (ref 0–0)
Neutrophils Absolute: 0.7 10*3/uL — ABNORMAL LOW (ref 1.4–7.0)
Neutrophils: 39 %
Platelets: 63 10*3/uL — CL (ref 150–450)
RBC: 4.1 x10E6/uL — ABNORMAL LOW (ref 4.14–5.80)
RDW: 12 % (ref 11.6–15.4)
WBC: 1.7 10*3/uL — CL (ref 3.4–10.8)

## 2021-12-11 LAB — CMP14+EGFR
ALT: 179 IU/L — ABNORMAL HIGH (ref 0–44)
AST: 387 IU/L — ABNORMAL HIGH (ref 0–40)
Albumin/Globulin Ratio: 1 — ABNORMAL LOW (ref 1.2–2.2)
Albumin: 4.1 g/dL (ref 3.8–4.8)
Alkaline Phosphatase: 85 IU/L (ref 44–121)
BUN/Creatinine Ratio: 10 (ref 10–24)
BUN: 7 mg/dL — ABNORMAL LOW (ref 8–27)
Bilirubin Total: 2.2 mg/dL — ABNORMAL HIGH (ref 0.0–1.2)
CO2: 24 mmol/L (ref 20–29)
Calcium: 8.3 mg/dL — ABNORMAL LOW (ref 8.6–10.2)
Chloride: 97 mmol/L (ref 96–106)
Creatinine, Ser: 0.7 mg/dL — ABNORMAL LOW (ref 0.76–1.27)
Globulin, Total: 4.2 g/dL (ref 1.5–4.5)
Glucose: 81 mg/dL (ref 70–99)
Potassium: 3.7 mmol/L (ref 3.5–5.2)
Sodium: 134 mmol/L (ref 134–144)
Total Protein: 8.3 g/dL (ref 6.0–8.5)
eGFR: 103 mL/min/{1.73_m2} (ref >59)

## 2021-12-11 LAB — LIPID PANEL
Chol/HDL Ratio: 2.4 ratio (ref 0.0–5.0)
Cholesterol, Total: 197 mg/dL (ref 100–199)
HDL: 82 mg/dL (ref >39)
LDL Chol Calc (NIH): 99 mg/dL (ref 0–99)
Triglycerides: 89 mg/dL (ref 0–149)
VLDL Cholesterol Cal: 16 mg/dL (ref 5–40)

## 2021-12-11 LAB — VITAMIN D 25 HYDROXY (VIT D DEFICIENCY, FRACTURES): Vit D, 25-Hydroxy: 26.1 ng/mL — ABNORMAL LOW (ref 30.0–100.0)

## 2021-12-11 LAB — VITAMIN B12: Vitamin B-12: 767 pg/mL (ref 232–1245)

## 2021-12-11 LAB — MAGNESIUM: Magnesium: 1.1 mg/dL — ABNORMAL LOW (ref 1.6–2.3)

## 2021-12-11 MED ORDER — VITAMIN D (ERGOCALCIFEROL) 1.25 MG (50000 UNIT) PO CAPS: 50000.0000 [IU] | ORAL_CAPSULE | ORAL | 0 refills | Status: DC

## 2021-12-15 ENCOUNTER — Telehealth: Payer: Self-pay

## 2021-12-15 NOTE — Chronic Care Management (AMB) (Signed)
?  Care Management  ? ?Outreach Note ? ?12/15/2021 ?Name: Kyle Hammond MRN: [id number] DOB: Jan 27, 1956 ? ?Referred by: Gwenlyn Perking, FNP ?Reason for referral : No chief complaint on file. ? ? ?An unsuccessful telephone outreach was attempted today. The patient was referred to the case management team for assistance with care management and care coordination.  ? ?Follow Up Plan:  ?The care management team will reach out to the patient again over the next 7 days.  ?If patient returns call to provider office, please advise to call [employer] * at [phone]* ? ?Noreene Larsson, RMA ?Care Guide, Embedded Care Coordination ?[facility]  ?[place], [place] [address] ?Direct Dial: [phone] ?[email or web address] ?Website: [email or web address]  ? ?

## 2021-12-24 ENCOUNTER — Encounter: Payer: Self-pay | Admitting: Family Medicine

## 2021-12-24 ENCOUNTER — Ambulatory Visit (INDEPENDENT_AMBULATORY_CARE_PROVIDER_SITE_OTHER): Payer: Medicare Other | Admitting: Family Medicine

## 2021-12-24 VITALS — BP 100/63 | HR 69 | Ht 66.0 in | Wt 124.0 lb

## 2021-12-24 DIAGNOSIS — D696 Thrombocytopenia, unspecified: Secondary | ICD-10-CM

## 2021-12-24 DIAGNOSIS — I10 Essential (primary) hypertension: Secondary | ICD-10-CM | POA: Diagnosis not present

## 2021-12-24 DIAGNOSIS — B182 Chronic viral hepatitis C: Secondary | ICD-10-CM

## 2021-12-24 NOTE — Patient Instructions (Signed)
Hypotension °As your heart beats, it forces blood through your body. Hypotension, commonly called low blood pressure, is when the force of blood pumping through your arteries is too weak. Arteries are blood vessels that carry blood from the heart throughout the body. Depending on the cause and severity, hypotension may be harmless (benign) or may cause serious problems (be critical). °When blood pressure is too low, you may not get enough blood to your brain or to the rest of your organs. This can cause weakness, light-headedness, rapid heartbeat, and fainting. °What are the causes? °This condition may be caused by: °Blood loss. °Loss of body fluids (dehydration). °Heart problems. °Hormone (endocrine) problems. °Pregnancy. °Severe infection. °Lack of certain nutrients. °Severe allergic reactions (anaphylaxis). °Certain medicines, such as blood pressure medicine or medicines that make the body lose excess fluids (diuretics). Sometimes, hypotension may be caused by not taking medicine as directed, such as taking too much of a certain medicine. °What increases the risk? °The following factors may make you more likely to develop this condition: °Age. Risk increases as you get older. °Conditions that affect the heart or the central nervous system. °Taking certain medicines, such as blood pressure medicine or diuretics. °Being pregnant. °What are the signs or symptoms? °Common symptoms of this condition include: °Weakness. °Light-headedness. °Dizziness. °Blurred vision. °Fatigue. °Rapid heartbeat. °Fainting, in severe cases. °How is this diagnosed? °This condition is diagnosed based on: °Your medical history. °Your symptoms. °Your blood pressure measurement. Your health care provider will check your blood pressure when you are: °Lying down. °Sitting. °Standing. °A blood pressure reading is recorded as two numbers, such as "120 over 80" (or 120/80). The first ("top") number is called the systolic pressure. It is a measure  of the pressure in your arteries as your heart beats. The second ("bottom") number is called the diastolic pressure. It is a measure of the pressure in your arteries when your heart relaxes between beats. Blood pressure is measured in a unit called mm Hg. Healthy blood pressure for most adults is 120/80. If your blood pressure is below 90/60, you may be diagnosed with hypotension. °Other information or tests that may be used to diagnose hypotension include: °Your other vital signs, such as your heart rate and temperature. °Blood tests. °Tilt table test. For this test, you will be safely secured to a table that moves you from a lying position to an upright position. Your heart rhythm and blood pressure will be monitored during the test. °How is this treated? °Treatment for this condition may include: °Changing your diet. This may involve eating more salt (sodium) or drinking more water. °Taking medicines to raise your blood pressure. °Changing the dosage of certain medicines you are taking that might be lowering your blood pressure. °Wearing compression stockings. These stockings help to prevent blood clots and reduce swelling in your legs. °In some cases, you may need to go to the hospital for: °Fluid replacement. This means you will receive fluids through an IV. °Blood replacement. This means you will receive donated blood through an IV (transfusion). °Treating an infection or heart problems, if this applies. °Monitoring. You may need to be monitored while medicines that you are taking wear off. °Follow these instructions at home: °Eating and drinking ° °Drink enough fluid to keep your urine pale yellow. °Eat a healthy diet, and follow instructions from your health care provider about eating or drinking restrictions. A healthy diet includes: °Fresh fruits and vegetables. °Whole grains. °Lean meats. °Low-fat dairy products. °Eat extra salt   only as directed. Do not add extra salt to your diet unless your health care  provider told you to do that. °Eat frequent, small meals. °Avoid standing up suddenly after eating. °Medicines °Take over-the-counter and prescription medicines only as told by your health care provider. °Follow instructions from your health care provider about changing the dosage of your current medicines, if this applies. °Do not stop or adjust any of your medicines on your own. °General instructions ° °Wear compression stockings as told by your health care provider. °Get up slowly from lying down or sitting positions. This gives your blood pressure a chance to adjust. °Avoid hot showers and excessive heat as directed by your health care provider. °Return to your normal activities as told by your health care provider. Ask your health care provider what activities are safe for you. °Do not use any products that contain nicotine or tobacco, such as cigarettes, e-cigarettes, and chewing tobacco. If you need help quitting, ask your health care provider. °Keep all follow-up visits as told by your health care provider. This is important. °Contact a health care provider if you: °Vomit. °Have diarrhea. °Have a fever for more than 2-3 days. °Feel more thirsty than usual. °Feel weak and tired. °Get help right away if you: °Have chest pain. °Have a fast or irregular heartbeat. °Develop numbness in any part of your body. °Cannot move your arms or your legs. °Have trouble speaking. °Become sweaty or feel light-headed. °Faint. °Feel short of breath. °Have trouble staying awake. °Feel confused. °Summary °Hypotension is when the force of blood pumping through your arteries is too weak. °Hypotension may be harmless (benign) or may cause serious problems (be critical). °Treatment for this condition may include changing your diet, changing your medicines, and wearing compression stockings. °In some cases, you may need to go to the hospital for fluid or blood replacement. °This information is not intended to replace advice given to  you by your health care provider. Make sure you discuss any questions you have with your health care provider. °Document Revised: 02/24/2018 Document Reviewed: 02/24/2018 °Elsevier Patient Education © 2022 Elsevier Inc. ° °

## 2021-12-24 NOTE — Progress Notes (Signed)
? ?Established Patient Office Visit ? ?Subjective:  ?Patient ID: Kyle Hammond, male    DOB: 1955/12/29  Age: 66 y.o. MRN: [id number] ? ?CC:  ?Chief Complaint  ?Patient presents with  ? Medical Management of Chronic Issues  ? Hypertension  ?  Pt states that he feels fine. He denies feeling light headed or dizzy. He denies HA, change in vision, chest pain.  ? ? ?HPI ?Kyle Hammond presents for HTN follow up. He reports that he has been taking amlodipine 2.5 mg daily that was started 2 weeks ago. He does not have a way to check his BP at home. He denies shortness of breath, chest pain, dizziness, HA, changes in vision, lightheadedness or falls. Denies fever, nausea, vomiting, or bleeding.  ? ?He was not aware that he had an appt with hematology next week.  ? ?He is aware that he has an appt with GI on 01/06/22 for chronic hep c.  ? ?BP Readings from Last 3 Encounters:  ?12/24/21 100/63  ?12/10/21 140/74  ?10/08/21 (!) 141/84  ? ? ? ?Past Medical History:  ?Diagnosis Date  ? Cataract   ? Hepatitis C   ? ? ?Past Surgical History:  ?Procedure Laterality Date  ? CATARACT EXTRACTION, BILATERAL  2019  ? HIP FRACTURE SURGERY Left 04/2016  ? ? ?Family History  ?Problem Relation Age of Onset  ? Cancer Mother   ?     bone  ? Heart disease Father   ? Heart attack Father   ? ? ?Social History  ? ?Socioeconomic History  ? Marital status: Divorced  ?  Spouse name: Not on file  ? Number of children: 2  ? Years of education: 69  ? Highest education level: High school graduate  ?Occupational History  ? Occupation: disabled  ?Tobacco Use  ? Smoking status: Every Day  ?  Packs/day: 1.00  ?  Years: 50.00  ?  Pack years: 50.00  ?  Types: Cigarettes  ? Smokeless tobacco: Never  ?Vaping Use  ? Vaping Use: Never used  ?Substance and Sexual Activity  ? Alcohol use: Yes  ?  Alcohol/week: 12.0 standard drinks  ?  Types: 12 Cans of beer per week  ? Drug use: No  ? Sexual activity: Not Currently  ?Other Topics Concern  ? Not on file  ?Social  History Narrative  ? Not on file  ? ?Social Determinants of Health  ? ?Financial Resource Strain: Not on file  ?Food Insecurity: Not on file  ?Transportation Needs: Not on file  ?Physical Activity: Not on file  ?Stress: Not on file  ?Social Connections: Not on file  ?Intimate Partner Violence: Not on file  ? ? ?Outpatient Medications Prior to Visit  ?Medication Sig Dispense Refill  ? amLODipine (NORVASC) 2.5 MG tablet Take 1 tablet (2.5 mg total) by mouth daily. 30 tablet 3  ? Vitamin D, Ergocalciferol, (DRISDOL) 1.25 MG (50000 UNIT) CAPS capsule Take 1 capsule (50,000 Units total) by mouth every 7 (seven) days. 8 capsule 0  ? ?No facility-administered medications prior to visit.  ? ? ?No Known Allergies ? ?ROS ?Review of Systems ?As per HPI.  ?  ?Objective:  ?  ?Physical Exam ?Vitals and nursing note reviewed.  ?Constitutional:   ?   General: He is not in acute distress. ?   Appearance: He is not ill-appearing, toxic-appearing or diaphoretic.  ?HENT:  ?   Mouth/Throat:  ?   Mouth: Mucous membranes are moist.  ?   Pharynx:  Oropharynx is clear.  ?Eyes:  ?   General: No scleral icterus. ?   Extraocular Movements: Extraocular movements intact.  ?   Pupils: Pupils are equal, round, and reactive to light.  ?Cardiovascular:  ?   Rate and Rhythm: Normal rate and regular rhythm.  ?   Heart sounds: Normal heart sounds. No murmur heard. ?Pulmonary:  ?   Effort: Pulmonary effort is normal. No respiratory distress.  ?   Breath sounds: Normal breath sounds.  ?Musculoskeletal:  ?   Right lower leg: No edema.  ?   Left lower leg: No edema.  ?Skin: ?   General: Skin is warm and dry.  ?Neurological:  ?   Mental Status: He is alert and oriented to person, place, and time. Mental status is at baseline.  ?Psychiatric:     ?   Mood and Affect: Mood normal.     ?   Behavior: Behavior normal.  ? ? ?BP 100/63   Pulse 69   Ht '5\' 6"'  (1.676 m)   Wt 124 lb (56.2 kg)   SpO2 98%   BMI 20.01 kg/m?  ?Wt Readings from Last 3 Encounters:   ?12/24/21 124 lb (56.2 kg)  ?12/10/21 125 lb 4 oz (56.8 kg)  ?10/08/21 127 lb 6.8 oz (57.8 kg)  ? ? ? ?Health Maintenance Due  ?Topic Date Due  ? TETANUS/TDAP  Never done  ? ? ?There are no preventive care reminders to display for this patient. ? ?Lab Results  ?Component Value Date  ? TSH 4.980 (H) 12/10/2021  ? ?Lab Results  ?Component Value Date  ? WBC 1.7 (LL) 12/10/2021  ? HGB 14.1 12/10/2021  ? HCT 40.3 12/10/2021  ? MCV 98 (H) 12/10/2021  ? PLT 63 (LL) 12/10/2021  ? ?Lab Results  ?Component Value Date  ? NA 134 12/10/2021  ? K 3.7 12/10/2021  ? CO2 24 12/10/2021  ? GLUCOSE 81 12/10/2021  ? BUN 7 (L) 12/10/2021  ? CREATININE 0.70 (L) 12/10/2021  ? BILITOT 2.2 (H) 12/10/2021  ? ALKPHOS 85 12/10/2021  ? AST 387 (H) 12/10/2021  ? ALT 179 (H) 12/10/2021  ? PROT 8.3 12/10/2021  ? ALBUMIN 4.1 12/10/2021  ? CALCIUM 8.3 (L) 12/10/2021  ? ANIONGAP 4 (L) 06/23/2016  ? EGFR 103 12/10/2021  ? ?Lab Results  ?Component Value Date  ? CHOL 197 12/10/2021  ? ?Lab Results  ?Component Value Date  ? HDL 82 12/10/2021  ? ?Lab Results  ?Component Value Date  ? [place] 99 12/10/2021  ? ?Lab Results  ?Component Value Date  ? TRIG 89 12/10/2021  ? ?Lab Results  ?Component Value Date  ? CHOLHDL 2.4 12/10/2021  ? ?No results found for: HGBA1C ? ?  ?Assessment & Plan:  ? ?Kyle Hammond was seen today for medical management of chronic issues and hypertension. ? ?Diagnoses and all orders for this visit: ? ?Primary hypertension ?BP is soft at 100/63 today. Will discontinue amlodipine today as BP were only mildly elevated prior to starting. He does report that he has not yet eaten today. Discussed hydration and regular food intake today. Labs are pending.  ?-     Basic Metabolic Panel ?-     CBC with Differential/Platelet ? ?Thrombocytopenia ([place]) ?Discussed appt time and date with hematology. AVS printed and appt time highlighted and shown again to patient. Discussed that last WBC was lower than typical for him. Will repeat today.  ? ?Chronic  hepatitis C without hepatic coma ([place]) ?Has upcoming appt with GI  for evaluation. Last LFTs were elevated. Discussed to avoid tylenol and alcohol and keep appt with GI.   ? ? ?Follow-up: Return in about 6 weeks (around 02/04/2022) for BP check.  ? ?The patient indicates understanding of these issues and agrees with the plan. ? ?Gwenlyn Perking, FNP ?

## 2021-12-25 LAB — CBC WITH DIFFERENTIAL/PLATELET
Basophils Absolute: 0 10*3/uL (ref 0.0–0.2)
Basos: 1 %
EOS (ABSOLUTE): 0.1 10*3/uL (ref 0.0–0.4)
Eos: 2 %
Hematocrit: 40.7 % (ref 37.5–51.0)
Hemoglobin: 14.1 g/dL (ref 13.0–17.7)
Immature Grans (Abs): 0 10*3/uL (ref 0.0–0.1)
Immature Granulocytes: 1 %
Lymphocytes Absolute: 1.8 10*3/uL (ref 0.7–3.1)
Lymphs: 61 %
MCH: 34.1 pg — ABNORMAL HIGH (ref 26.6–33.0)
MCHC: 34.6 g/dL (ref 31.5–35.7)
MCV: 98 fL — ABNORMAL HIGH (ref 79–97)
Monocytes Absolute: 0.2 10*3/uL (ref 0.1–0.9)
Monocytes: 7 %
Neutrophils Absolute: 0.8 10*3/uL — ABNORMAL LOW (ref 1.4–7.0)
Neutrophils: 28 %
Platelets: 104 10*3/uL — ABNORMAL LOW (ref 150–450)
RBC: 4.14 x10E6/uL (ref 4.14–5.80)
RDW: 12.1 % (ref 11.6–15.4)
WBC: 3 10*3/uL — ABNORMAL LOW (ref 3.4–10.8)

## 2021-12-25 LAB — BASIC METABOLIC PANEL
BUN/Creatinine Ratio: 11 (ref 10–24)
BUN: 9 mg/dL (ref 8–27)
CO2: 24 mmol/L (ref 20–29)
Calcium: 8.7 mg/dL (ref 8.6–10.2)
Chloride: 102 mmol/L (ref 96–106)
Creatinine, Ser: 0.79 mg/dL (ref 0.76–1.27)
Glucose: 80 mg/dL (ref 70–99)
Potassium: 4 mmol/L (ref 3.5–5.2)
Sodium: 139 mmol/L (ref 134–144)
eGFR: 99 mL/min/{1.73_m2} (ref >59)

## 2021-12-29 ENCOUNTER — Inpatient Hospital Stay (HOSPITAL_COMMUNITY): Payer: Medicare Other

## 2021-12-29 ENCOUNTER — Ambulatory Visit (HOSPITAL_COMMUNITY): Payer: Medicare Other | Admitting: Hematology

## 2022-01-02 NOTE — Chronic Care Management (AMB) (Signed)
?  Care Management  ? ?Note ? ?01/02/2022 ?Name: Kyle Hammond MRN: [id number] DOB: [birth year] ? ?Kyle Hammond is a 66 y.o. year old male who is a primary care patient of Gwenlyn Perking, FNP. I reached out to Jacky Kindle by phone today offer care coordination services.  ? ?Kyle Hammond was given information about care management services today including:  ?Care management services include personalized support from designated clinical staff supervised by his physician, including individualized plan of care and coordination with other care providers ?24/7 contact phone numbers for assistance for urgent and routine care needs. ?The patient may stop care management services at any time by phone call to the office staff. ? ?Patient agreed to services and verbal consent obtained.  ? ?Follow up plan: ?Telephone appointment with care management team member scheduled for:01/07/2022 ? ?Noreene Larsson, RMA ?Care Guide, Embedded Care Coordination ?[facility]  Care Management  ?[place], [place] [address] ?Direct Dial: [phone] ?Museum/gallery [email or web address] ?Website: [email or web address]  ? ?

## 2022-01-06 ENCOUNTER — Encounter: Payer: Self-pay | Admitting: *Deleted

## 2022-01-06 ENCOUNTER — Ambulatory Visit: Payer: Medicare Other | Admitting: Gastroenterology

## 2022-01-06 ENCOUNTER — Encounter: Payer: Self-pay | Admitting: Gastroenterology

## 2022-01-06 VITALS — BP 124/60 | HR 72 | Temp 97.5°F | Ht 66.0 in | Wt 125.4 lb

## 2022-01-06 DIAGNOSIS — Z860100 Personal history of colon polyps, unspecified: Secondary | ICD-10-CM | POA: Insufficient documentation

## 2022-01-06 DIAGNOSIS — K7030 Alcoholic cirrhosis of liver without ascites: Secondary | ICD-10-CM

## 2022-01-06 DIAGNOSIS — B182 Chronic viral hepatitis C: Secondary | ICD-10-CM | POA: Diagnosis not present

## 2022-01-06 NOTE — Progress Notes (Signed)
? ? ? ? ? ?Gastroenterology Office Note   ? ?Referring Provider: Gwenlyn Perking, FNP ?Primary Care Physician:  Gwenlyn Perking, FNP  ? ? ? ?Chief Complaint  ? ?Chief Complaint  ?Patient presents with  ? New Patient (Initial Visit)  ?  Hep. C  ? ? ? ?History of Present Illness  ? ?Kyle Hammond is a 66 y.o. male presenting today at the request of Gwenlyn Perking, FNP due to chronic Hepatitis C. He had a positive Hep C antibody in Jan 2023. [place] Kitchen Hep B surface antibody non-reactive. Hep B surface antigen negative. Elevated ferritin at 414, sats 76. HIV negative.  ? ?Followed by Hematology due to thrombocytopenia. Felt to be related to chronic Hep C.  ? ?Last [place] in Feb 2022. Cirrhosis noted. No mental status changes or confusion. Drinks alcohol (beer). Not interested in quitting at this time. No overt GI bleeding. Last colonoscopy in remote past per patient with multiple polyps removed. Unclear what year.  ? ? ? ? ? ?Past Medical History:  ?Diagnosis Date  ? Cataract   ? Hepatitis C   ? ? ?Past Surgical History:  ?Procedure Laterality Date  ? CATARACT EXTRACTION, BILATERAL  2019  ? HIP FRACTURE SURGERY Left 04/2016  ? ? ?Current Outpatient Medications  ?Medication Sig Dispense Refill  ? amLODipine (NORVASC) 2.5 MG tablet Take 1 tablet (2.5 mg total) by mouth daily. (Patient not taking: Reported on 01/06/2022) 30 tablet 3  ? Vitamin D, Ergocalciferol, (DRISDOL) 1.25 MG (50000 UNIT) CAPS capsule Take 1 capsule (50,000 Units total) by mouth every 7 (seven) days. (Patient not taking: Reported on 01/06/2022) 8 capsule 0  ? ?No current facility-administered medications for this visit.  ? ? ?Allergies as of 01/06/2022  ? (No Known Allergies)  ? ? ?Family History  ?Problem Relation Age of Onset  ? Cancer Mother   ?     bone  ? Heart disease Father   ? Heart attack Father   ? Colon cancer Neg Hx   ? Colon polyps Neg Hx   ? ? ?Social History  ? ?Socioeconomic History  ? Marital status: Divorced  ?  Spouse name: Not on file  ?  Number of children: 2  ? Years of education: 31  ? Highest education level: High school graduate  ?Occupational History  ? Occupation: disabled  ?Tobacco Use  ? Smoking status: Every Day  ?  Packs/day: 1.00  ?  Years: 50.00  ?  Pack years: 50.00  ?  Types: Cigarettes  ? Smokeless tobacco: Never  ?Vaping Use  ? Vaping Use: Never used  ?Substance and Sexual Activity  ? Alcohol use: Yes  ?  Alcohol/week: 12.0 standard drinks  ?  Types: 12 Cans of beer per week  ? Drug use: No  ? Sexual activity: Not Currently  ?Other Topics Concern  ? Not on file  ?Social History Narrative  ? Not on file  ? ?Social Determinants of Health  ? ?Financial Resource Strain: Not on file  ?Food Insecurity: Not on file  ?Transportation Needs: Not on file  ?Physical Activity: Not on file  ?Stress: Not on file  ?Social Connections: Not on file  ?Intimate Partner Violence: Not on file  ? ? ? ?Review of Systems  ? ?Gen: Denies any fever, chills, fatigue, weight loss, lack of appetite.  ?CV: Denies chest pain, heart palpitations, peripheral edema, syncope.  ?Resp: Denies shortness of breath at rest or with exertion. Denies wheezing or cough.  ?  GI:see HPI ?GU : Denies urinary burning, urinary frequency, urinary hesitancy ?MS: Denies joint pain, muscle weakness, cramps, or limitation of movement.  ?Derm: Denies rash, itching, dry skin ?Psych: Denies depression, anxiety, memory loss, and confusion ?Heme: Denies bruising, bleeding, and enlarged lymph nodes. ? ? ?Physical Exam  ? ?BP 124/60   Pulse 72   Temp (!) 97.5 ?F (36.4 ?C)   Ht '5\' 6"'$  (1.676 m)   Wt 125 lb 6.4 oz (56.9 kg)   BMI 20.24 kg/m?  ?General:   Alert and oriented. Pleasant and cooperative. Well-nourished and well-developed.  ?Head:  Normocephalic and atraumatic. ?Eyes:  Without icterus ?Ears:  Normal auditory acuity. ?Lungs:  Clear to auscultation bilaterally.  ?Heart:  S1, S2 present without murmurs appreciated.  ?Abdomen:  +BS, soft, non-tender and non-distended. No HSM noted. No  guarding or rebound. No masses appreciated.  ?Rectal:  Deferred  ?Msk:  Symmetrical without gross deformities. Normal posture. ?Extremities:  Without edema. ?Neurologic:  Alert and  oriented x4;  grossly normal neurologically. ?Skin:  Intact without significant lesions or rashes. ?Psych:  Alert and cooperative. Normal mood and affect. ? ? ?Assessment  ? ?Kyle Hammond is a 67 y.o. male presenting today at the request of Marjorie Smolder, FNP, to be evaluated for chronic Hep C.  ? ? ?Hep C: with concerns for cirrhosis on prior imaging in Feb 2022. Need to update [place].  Hep B surface antibody non-reactive. Hep B surface antigen negative. We need to check Hep C RNA as well to obtain viral load and also genotype. Currently, he is drinking alcohol and not interested in quitting. Alcohol use is not a contraindication for treatment but does raise concern for compliance. Daughter is present with him today.  ? ?Cirrhosis: on [place] in Feb 2022. Need to update [place] now. Will need EGD for variceal screening. Remains compensated. Will check multiple labs today.  ? ?Elevated ferritin: I suspect this is due to liver disease, ETOH intake. Sats also elevated at 76. Check hemochromatosis labs.  ? ? ?History of polyps: in distant past. Patient states "14 polyps" removed. Will pursue surveillance colonoscopy at time of EGD.  ? ? ? ? ?PLAN  ? ?US abdomen  ? ?Multiple labs ordered today including Hep C RNA with genotype and AFP tumor marker ? ?Proceed with colonoscopy/EGD by Dr. Abbey Chatters  in near future: the risks, benefits, and alternatives have been discussed with the patient in detail. The patient states understanding and desires to proceed.  ? ?Further recommendations to follow once labs reviewed ? ?Recommend ETOH cessation  ? ?3 month return ? ? ?Annitta Needs, PhD, ANP-BC ?[facility]  ? ? ?

## 2022-01-06 NOTE — Patient Instructions (Addendum)
Please have blood work done today.  ? ?We are scheduling an ultrasound of your liver in the near future! ? ?We are also arranging a colonoscopy due to history of polyps. You will have an upper endoscopy at the same time to screen for enlarged blood vessels in your esophagus. ? ?I will see you in 3 months! ? ?As we talked about, it is important to stop drinking to keep your liver functioning.  ? ? ?It was a pleasure to see you today. I want to create trusting relationships with patients to provide genuine, compassionate, and quality care. I value your feedback. If you receive a survey regarding your visit,  I greatly appreciate you taking time to fill this out.  ? ?Annitta Needs, PhD, ANP-BC ?[facility]  ? ? ? ?

## 2022-01-07 ENCOUNTER — Telehealth: Payer: Medicare Other

## 2022-01-07 ENCOUNTER — Telehealth: Payer: Self-pay | Admitting: *Deleted

## 2022-01-07 NOTE — Telephone Encounter (Signed)
?  Care Management  ? ?Follow Up Note ? ? ?01/07/2022 ?Name: Kyle Hammond MRN: [id number] DOB: 1956-06-03 ? ? ?Referred by: Gwenlyn Perking, FNP ?Reason for referral : Chronic Care Management (Unsuccessful initial telephone visit) ? ? ?An unsuccessful telephone outreach was attempted today. The patient was referred to the case management team for assistance with care management and care coordination.  ? ?Follow Up Plan:  Forwarding to Care Guide for outreach and rescheduling.  ? ?Chong Sicilian, BSN, RN-BC ?Embedded Chronic Care Manager ?[place] [facility] [place] Management ?Direct Dial: [phone] ? ? ?

## 2022-01-12 ENCOUNTER — Telehealth: Payer: Self-pay

## 2022-01-12 NOTE — Chronic Care Management (AMB) (Signed)
?  Care Management  ? ?Note ? ?01/12/2022 ?Name: [name] MRN: [id number] DOB: May 13, 1956 ? ?Kyle Hammond is a 66 y.o. year old male who is a primary care patient of Gwenlyn Perking, FNP and is actively engaged with the care management team. I reached out to Jacky Kindle by phone today to assist with re-scheduling an initial visit with the RN Case Manager ? ?Follow up plan: ?Spoke with Daughter and she states that she will reach out and discuss with patient about rescheduling. States that patient will not answer due to being hard of hearing. The care management team will reach out to the patient again over the next 7 days.  ?If patient returns call to provider office, please advise to call [facility]  at [phone] ? ?Noreene Larsson, RMA ?Care Guide, Embedded Care Coordination ?[facility]  Care Management  ?[place], [place] [address] ?Direct Dial: [phone] ?Museum/gallery [email or web address] ?Website: [email or web address]  ? ?

## 2022-01-16 ENCOUNTER — Ambulatory Visit (HOSPITAL_COMMUNITY)
Admission: RE | Admit: 2022-01-16 | Discharge: 2022-01-16 | Disposition: A | Discharge status: Home / Self Care | Payer: Medicare Other | Source: Ambulatory Visit | Attending: Gastroenterology | Admitting: Gastroenterology

## 2022-01-16 DIAGNOSIS — K7030 Alcoholic cirrhosis of liver without ascites: Secondary | ICD-10-CM | POA: Insufficient documentation

## 2022-01-16 DIAGNOSIS — K7689 Other specified diseases of liver: Secondary | ICD-10-CM | POA: Diagnosis not present

## 2022-01-20 LAB — IGG, IGA, IGM
IgG (Immunoglobin G), Serum: 3733 mg/dL — ABNORMAL HIGH (ref 600–1540)
IgM, Serum: 126 mg/dL (ref 50–300)
Immunoglobulin A: 352 mg/dL — ABNORMAL HIGH (ref 70–320)

## 2022-01-20 LAB — CERULOPLASMIN: Ceruloplasmin: 25 mg/dL (ref 18–36)

## 2022-01-20 LAB — HEPATITIS C RNA QUANTITATIVE
HCV Quantitative Log: 6.28 log IU/mL — ABNORMAL HIGH
HCV RNA, PCR, QN: 1890000 IU/mL — ABNORMAL HIGH

## 2022-01-20 LAB — COMPLETE METABOLIC PANEL WITH GFR
AG Ratio: 0.7 (calc) — ABNORMAL LOW (ref 1.0–2.5)
ALT: 178 U/L — ABNORMAL HIGH (ref 9–46)
AST: 354 U/L — ABNORMAL HIGH (ref 10–35)
Albumin: 4 g/dL (ref 3.6–5.1)
Alkaline phosphatase (APISO): 86 U/L (ref 35–144)
BUN: 9 mg/dL (ref 7–25)
CO2: 27 mmol/L (ref 20–32)
Calcium: 8.7 mg/dL (ref 8.6–10.3)
Chloride: 100 mmol/L (ref 98–110)
Creat: 0.76 mg/dL (ref 0.70–1.35)
Globulin: 5.4 g/dL (calc) — ABNORMAL HIGH (ref 1.9–3.7)
Glucose, Bld: 88 mg/dL (ref 65–139)
Potassium: 3.5 mmol/L (ref 3.5–5.3)
Sodium: 139 mmol/L (ref 135–146)
Total Bilirubin: 1.8 mg/dL — ABNORMAL HIGH (ref 0.2–1.2)
Total Protein: 9.4 g/dL — ABNORMAL HIGH (ref 6.1–8.1)
eGFR: 100 mL/min/{1.73_m2} (ref >=60)

## 2022-01-20 LAB — ANTI-NUCLEAR AB-TITER (ANA TITER): ANA Titer 1: 1:80 {titer} — ABNORMAL HIGH

## 2022-01-20 LAB — ANA: Anti Nuclear Antibody (ANA): POSITIVE — AB

## 2022-01-20 LAB — ALPHA-1 ANTITRYPSIN PHENOTYPE: A-1 Antitrypsin, Ser: 188 mg/dL (ref 83–199)

## 2022-01-20 LAB — PROTIME-INR
INR: 1.1
Prothrombin Time: 11.6 s — ABNORMAL HIGH (ref 9.0–11.5)

## 2022-01-20 LAB — MITOCHONDRIAL ANTIBODIES: Mitochondrial M2 Ab, IgG: <=20 U (ref <=20.0)

## 2022-01-20 LAB — AFP TUMOR MARKER: AFP-Tumor Marker: 29.9 ng/mL — ABNORMAL HIGH (ref <6.1)

## 2022-01-20 LAB — ANTI-SMOOTH MUSCLE ANTIBODY, IGG: Actin (Smooth Muscle) Antibody (IGG): 31 U — ABNORMAL HIGH (ref <20)

## 2022-01-20 LAB — HEPATITIS C GENOTYPE

## 2022-01-20 LAB — HEMOCHROMATOSIS DNA-PCR(C282Y,H63D)

## 2022-01-21 ENCOUNTER — Telehealth: Payer: Self-pay | Admitting: *Deleted

## 2022-01-21 NOTE — Telephone Encounter (Signed)
Called pt daughter [name]. LMOVM to call back to schedule TCS/EGD with Dr, Abbey Chatters, ASA 3 ?

## 2022-01-26 ENCOUNTER — Other Ambulatory Visit: Payer: Self-pay

## 2022-01-26 ENCOUNTER — Encounter: Payer: Self-pay | Admitting: Gastroenterology

## 2022-01-26 DIAGNOSIS — R772 Abnormality of alphafetoprotein: Secondary | ICD-10-CM

## 2022-01-26 DIAGNOSIS — B182 Chronic viral hepatitis C: Secondary | ICD-10-CM

## 2022-01-27 NOTE — Chronic Care Management (AMB) (Signed)
?  Care Coordination ?Note ? ?01/27/2022 ?Name: Kyle Hammond MRN: [id number] DOB: 06-Mar-1956 ? ?Kyle Hammond is a 66 y.o. year old male who is a primary care patient of Gwenlyn Perking, FNP and is actively engaged with the care management team. I reached out to Jacky Kindle by phone today to assist with re-scheduling an initial visit with the RN Case Manager ? ?Follow up plan: ?Patient declines further follow up and engagement by the care management team. Appropriate care team members and provider have been notified via electronic communication.  ? ?Noreene Larsson, RMA ?Care Guide, Embedded Care Coordination ?[facility]  Care Management  ?[place], [place] [address] ?Direct Dial: [phone] ?Museum/gallery [email or web address] ?Website: [email or web address]  ? ?

## 2022-02-04 ENCOUNTER — Encounter: Payer: Self-pay | Admitting: Family Medicine

## 2022-02-04 ENCOUNTER — Ambulatory Visit: Payer: Medicare Other | Admitting: Family Medicine

## 2022-02-04 ENCOUNTER — Ambulatory Visit (INDEPENDENT_AMBULATORY_CARE_PROVIDER_SITE_OTHER): Payer: Medicare Other | Admitting: Family Medicine

## 2022-02-04 VITALS — BP 113/71 | HR 84 | Temp 98.5°F | Ht 66.0 in | Wt 121.5 lb

## 2022-02-04 DIAGNOSIS — K7030 Alcoholic cirrhosis of liver without ascites: Secondary | ICD-10-CM | POA: Diagnosis not present

## 2022-02-04 DIAGNOSIS — F1097 Alcohol use, unspecified with alcohol-induced persisting dementia: Secondary | ICD-10-CM

## 2022-02-04 DIAGNOSIS — B182 Chronic viral hepatitis C: Secondary | ICD-10-CM

## 2022-02-04 DIAGNOSIS — I10 Essential (primary) hypertension: Secondary | ICD-10-CM

## 2022-02-04 MED ORDER — FLUOXETINE HCL 10 MG PO CAPS: 10.0000 mg | ORAL_CAPSULE | Freq: Every day | ORAL | 1 refills | Status: DC

## 2022-02-04 NOTE — Progress Notes (Signed)
Established Patient Office Visit  Subjective   Patient ID: Kyle Hammond, male    DOB: 14-Jun-1956  Age: 66 y.o. MRN: [id number]  Chief Complaint  Patient presents with   Blood Pressure Check    HPI Kyle Hammond is here for a BP check. He reports doing well overall. History is somewhat difficult to obtain due to patient's dementia. He has established with GI. He has an upcoming MRI of his liver. They have also started to discussed treatment options for Hep C pending MRI results.   He reports increased anxiety and nervousness for the last 2 months. It has been occurring daily and lasts for the majority of the day. He is tearful today. He reports that he worries about his family all the time and can't stop worrying about them. He has never taken medication for anxiety but would like to try something now.      02/04/2022   10:01 AM 12/24/2021   11:08 AM 12/10/2021   11:10 AM  Depression screen PHQ 2/9  Decreased Interest 0 0 0  Down, Depressed, Hopeless 0 0 0  PHQ - 2 Score 0 0 0  Altered sleeping 0 0 0  Tired, decreased energy 0 0 0  Change in appetite 0 0 0  Feeling bad or failure about yourself  0 0 0  Trouble concentrating 0 0 0  Moving slowly or fidgety/restless 0 0 0  Suicidal thoughts 0 0 0  PHQ-9 Score 0 0 0  Difficult doing work/chores Not difficult at all  Not difficult at all      02/04/2022   10:01 AM 12/10/2021   11:10 AM  GAD 7 : Generalized Anxiety Score  Nervous, Anxious, on Edge 0 0  Control/stop worrying 0 0  Worry too much - different things 0 0  Trouble relaxing 0 0  Restless 0 0  Easily annoyed or irritable 0 0  Afraid - awful might happen 0 0  Total GAD 7 Score 0 0  Anxiety Difficulty Not difficult at all Not difficult at all     Patient Active Problem List   Diagnosis Date Noted   History of colonic polyps 01/06/2022   Thrombocytopenia ([place]) 12/10/2021   Hyperlipidemia 12/10/2021   Tobacco use disorder, continuous 09/10/2021   Tobacco use  11/29/2020   Mild dementia ([place]) 11/29/2020   Left inguinal hernia 11/29/2020   At risk for falls 06/01/2016   Senile osteoporosis 06/01/2016   Vitamin D deficiency 06/01/2016   Current smoker 44/31/5400   Uncomplicated alcohol dependence ([place]) 05/19/2016   Chronic hepatitis C without hepatic coma ([place]) 05/19/2016   Anemia 05/19/2016   Low magnesium level 05/19/2016   Closed fracture of left femur ([place]) 05/07/2016      ROS As per HPI.    Objective:     BP 113/71   Pulse 84   Temp 98.5 F (36.9 C) (Temporal)   Ht '5\' 6"'  (1.676 m)   Wt 121 lb 8 oz (55.1 kg)   SpO2 98%   BMI 19.61 kg/m  BP Readings from Last 3 Encounters:  02/04/22 113/71  01/06/22 124/60  12/24/21 100/63      Physical Exam Vitals and nursing note reviewed.  Constitutional:      General: He is not in acute distress.    Appearance: He is not toxic-appearing or diaphoretic.  Eyes:     General: No scleral icterus. Cardiovascular:     Rate and Rhythm: Normal rate and regular rhythm.  Heart sounds: Normal heart sounds. No murmur heard. Pulmonary:     Effort: Pulmonary effort is normal. No respiratory distress.     Breath sounds: Normal breath sounds.  Abdominal:     General: Bowel sounds are normal. There is no distension.     Palpations: Abdomen is soft.     Tenderness: There is no abdominal tenderness. There is no guarding or rebound.  Skin:    General: Skin is warm and dry.  Neurological:     Mental Status: He is alert and oriented to person, place, and time. Mental status is at baseline.  Psychiatric:        Mood and Affect: Affect is tearful.        Speech: Speech normal.        Behavior: Behavior normal.        Thought Content: Thought content normal.        Cognition and Memory: Cognition is impaired. Memory is impaired.     No results found for any visits on 02/04/22.  Last CBC Lab Results  Component Value Date   WBC 3.0 (L) 12/24/2021   HGB 14.1 12/24/2021   HCT 40.7  12/24/2021   MCV 98 (H) 12/24/2021   MCH 34.1 (H) 12/24/2021   RDW 12.1 12/24/2021   PLT 104 (L) 79/48/0165   Last metabolic panel Lab Results  Component Value Date   GLUCOSE 88 01/06/2022   NA 139 01/06/2022   K 3.5 01/06/2022   CL 100 01/06/2022   CO2 27 01/06/2022   BUN 9 01/06/2022   CREATININE 0.76 01/06/2022   EGFR 100 01/06/2022   CALCIUM 8.7 01/06/2022   PROT 9.4 (H) 01/06/2022   ALBUMIN 4.1 12/10/2021   LABGLOB 4.2 12/10/2021   AGRATIO 1.0 (L) 12/10/2021   BILITOT 1.8 (H) 01/06/2022   ALKPHOS 85 12/10/2021   AST 354 (H) 01/06/2022   ALT 178 (H) 01/06/2022   ANIONGAP 4 (L) 06/23/2016   Last lipids Lab Results  Component Value Date   CHOL 197 12/10/2021   HDL 82 12/10/2021   LDLCALC 99 12/10/2021   TRIG 89 12/10/2021   CHOLHDL 2.4 12/10/2021      The 10-year ASCVD risk score (Arnett DK, et al., 2019) is: 10%    Assessment & Plan:   Kyle Hammond was seen today for blood pressure check.  Diagnoses and all orders for this visit:  Primary hypertension BP well controlled today without medication.   Mild dementia associated with alcoholism, with anxiety (HCC) MMSE score of 24 today. Negative GAD today but patient reports daily anxiety for the last 2 months that is very troublesome. He is tearful today talking about it. Will try low dose of prozac due to hepatic impairment. Discussed compliance with medication and the need to take daily for effectiveness.  -     FLUoxetine (PROZAC) 10 MG capsule; Take 1 capsule (10 mg total) by mouth daily.  Chronic hepatitis C without hepatic coma (HCC) Alcoholic cirrhosis of liver without ascites ([place]) Has established with GI now. Has upcoming MRI of liver.   Return in about 3 months (around 05/07/2022) for medication follow up.  The patient indicates understanding of these issues and agrees with the plan.    Gwenlyn Perking, FNP

## 2022-02-04 NOTE — Patient Instructions (Signed)

## 2022-02-12 ENCOUNTER — Ambulatory Visit (HOSPITAL_COMMUNITY): Payer: Medicare Other

## 2022-02-13 ENCOUNTER — Ambulatory Visit: Payer: Medicare Other | Admitting: Family Medicine

## 2022-02-16 ENCOUNTER — Telehealth: Payer: Self-pay | Admitting: Internal Medicine

## 2022-02-16 NOTE — Telephone Encounter (Signed)
Patient sister called to schedule procedure [phone]

## 2022-02-16 NOTE — Telephone Encounter (Signed)
On pt dpr he only has daughter [name]. Sister is going to have pt call in to give [place] permission to speak with her

## 2022-02-17 ENCOUNTER — Encounter: Payer: Self-pay | Admitting: *Deleted

## 2022-02-17 MED ORDER — PEG 3350-KCL-NA BICARB-NACL 420 G PO SOLR: ORAL | 0 refills | Status: DC

## 2022-02-17 NOTE — Telephone Encounter (Signed)
Spoke with pt. He gave verbal consent to speak with sister to schedule whatever is needed for pt. He wants her added to his consent so he will need to update this at next OV.  Called sister and LMOVM to call back.

## 2022-02-17 NOTE — Addendum Note (Signed)
Addended by: Cheron Every on: 02/17/2022 02:30 PM   Modules accepted: Orders

## 2022-02-17 NOTE — Telephone Encounter (Signed)
Sister called back. He has been scheduled for 7/10 at 11:15am. She advised me to mail instructions to daughter [name] [address] in [place]. Send prep rx to [facility].   PA done via [facility]. Auth# [id number], DOS: Mar 23, 2022 - Jun 21, 2022

## 2022-02-27 ENCOUNTER — Ambulatory Visit (HOSPITAL_COMMUNITY)
Admission: RE | Admit: 2022-02-27 | Discharge: 2022-02-27 | Disposition: A | Discharge status: Home / Self Care | Payer: Medicare Other | Source: Ambulatory Visit | Attending: Gastroenterology | Admitting: Gastroenterology

## 2022-02-27 DIAGNOSIS — R772 Abnormality of alphafetoprotein: Secondary | ICD-10-CM | POA: Diagnosis not present

## 2022-02-27 DIAGNOSIS — B182 Chronic viral hepatitis C: Secondary | ICD-10-CM | POA: Insufficient documentation

## 2022-02-27 DIAGNOSIS — K7460 Unspecified cirrhosis of liver: Secondary | ICD-10-CM | POA: Diagnosis not present

## 2022-02-27 DIAGNOSIS — K7689 Other specified diseases of liver: Secondary | ICD-10-CM | POA: Diagnosis not present

## 2022-02-27 MED ORDER — GADOBUTROL 1 MMOL/ML IV SOLN
5.0000 mL | Freq: Once | INTRAVENOUS | Status: AC | PRN
Start: 2022-02-27 — End: 2022-02-27
  Administered 2022-02-27: 5 mL via INTRAVENOUS

## 2022-03-16 ENCOUNTER — Ambulatory Visit: Payer: Medicare Other | Admitting: Gastroenterology

## 2022-03-19 ENCOUNTER — Other Ambulatory Visit (HOSPITAL_COMMUNITY): Payer: Medicare Other

## 2022-03-19 NOTE — Patient Instructions (Signed)
Kyle Hammond  03/19/2022     '@PREFPERIOPPHARMACY'$ @   Your procedure is scheduled on  03/23/2022.   Report to [facility] at  [place].   Call this number if you have problems the morning of surgery:  [phone]   Remember:   Follow the diet and prep instructions given to you by the office.    Take these medicines the morning of surgery with A SIP OF WATER                                         None.    Do not wear jewelry, make-up or nail polish.  Do not wear lotions, powders, or perfumes, or deodorant.  Do not shave 48 hours prior to surgery.  Men may shave face and neck.  Do not bring valuables to the hospital.  [facility] is not responsible for any belongings or valuables.  Contacts, dentures or bridgework may not be worn into surgery.  Leave your suitcase in the car.  After surgery it may be brought to your room.  For patients admitted to the hospital, discharge time will be determined by your treatment team.  Patients discharged the day of surgery will not be allowed to drive home and must have someone with them for 24 hours.    Special instructions:   DO NOT smoke tobacco or vape for 24 hours before your procedure.  Please read over the following fact sheets that you were given. Anesthesia Post-op Instructions and Care and Recovery After Surgery      Upper Endoscopy, Adult, Care After This sheet gives you information about how to care for yourself after your procedure. Your health care provider may also give you more specific instructions. If you have problems or questions, contact your health care provider. What can I expect after the procedure? After the procedure, it is common to have: A sore throat. Mild stomach pain or discomfort. Bloating. Nausea. Follow these instructions at home:  Follow instructions from your health care provider about what to eat or drink after your procedure. Return to your normal activities as told by your health care  provider. Ask your health care provider what activities are safe for you. Take over-the-counter and prescription medicines only as told by your health care provider. If you were given a sedative during the procedure, it can affect you for several hours. Do not drive or operate machinery until your health care provider says that it is safe. Keep all follow-up visits as told by your health care provider. This is important. Contact a health care provider if you have: A sore throat that lasts longer than one day. Trouble swallowing. Get help right away if: You vomit blood or your vomit looks like coffee grounds. You have: A fever. Bloody, black, or tarry stools. A severe sore throat or you cannot swallow. Difficulty breathing. Severe pain in your chest or abdomen. Summary After the procedure, it is common to have a sore throat, mild stomach discomfort, bloating, and nausea. If you were given a sedative during the procedure, it can affect you for several hours. Do not drive or operate machinery until your health care provider says that it is safe. Follow instructions from your health care provider about what to eat or drink after your procedure. Return to your normal activities as told by your health care  provider. This information is not intended to replace advice given to you by your health care provider. Make sure you discuss any questions you have with your health care provider. Document Revised: 07/07/2019 Document Reviewed: 01/31/2018 Elsevier Patient Education  [place]. Colonoscopy, Adult, Care After The following information offers guidance on how to care for yourself after your procedure. Your health care provider may also give you more specific instructions. If you have problems or questions, contact your health care provider. What can I expect after the procedure? After the procedure, it is common to have: A small amount of blood in your stool for 24 hours after the  procedure. Some gas. Mild cramping or bloating of your abdomen. Follow these instructions at home: Eating and drinking  Drink enough fluid to keep your urine pale yellow. Follow instructions from your health care provider about eating or drinking restrictions. Resume your normal diet as told by your health care provider. Avoid heavy or fried foods that are hard to digest. Activity Rest as told by your health care provider. Avoid sitting for a long time without moving. Get up to take short walks every 1-2 hours. This is important to improve blood flow and breathing. Ask for help if you feel weak or unsteady. Return to your normal activities as told by your health care provider. Ask your health care provider what activities are safe for you. Managing cramping and bloating  Try walking around when you have cramps or feel bloated. If directed, apply heat to your abdomen as told by your health care provider. Use the heat source that your health care provider recommends, such as a moist heat pack or a heating pad. Place a towel between your skin and the heat source. Leave the heat on for 20-30 minutes. Remove the heat if your skin turns bright red. This is especially important if you are unable to feel pain, heat, or cold. You have a greater risk of getting burned. General instructions If you were given a sedative during the procedure, it can affect you for several hours. Do not drive or operate machinery until your health care provider says that it is safe. For the first 24 hours after the procedure: Do not sign important documents. Do not drink alcohol. Do your regular daily activities at a slower pace than normal. Eat soft foods that are easy to digest. Take over-the-counter and prescription medicines only as told by your health care provider. Keep all follow-up visits. This is important. Contact a health care provider if: You have blood in your stool 2-3 days after the procedure. Get  help right away if: You have more than a small spotting of blood in your stool. You have large blood clots in your stool. You have swelling of your abdomen. You have nausea or vomiting. You have a fever. You have increasing pain in your abdomen that is not relieved with medicine. These symptoms may be an emergency. Get help right away. Call 911. Do not wait to see if the symptoms will go away. Do not drive yourself to the hospital. Summary After the procedure, it is common to have a small amount of blood in your stool. You may also have mild cramping and bloating of your abdomen. If you were given a sedative during the procedure, it can affect you for several hours. Do not drive or operate machinery until your health care provider says that it is safe. Get help right away if you have a lot of blood in  your stool, nausea or vomiting, a fever, or increased pain in your abdomen. This information is not intended to replace advice given to you by your health care provider. Make sure you discuss any questions you have with your health care provider. Document Revised: 04/23/2021 Document Reviewed: 04/23/2021 Elsevier Patient Education  [place] After This sheet gives you information about how to care for yourself after your procedure. Your health care provider may also give you more specific instructions. If you have problems or questions, contact your health care provider. What can I expect after the procedure? After the procedure, it is common to have: Tiredness. Forgetfulness about what happened after the procedure. Impaired judgment for important decisions. Nausea or vomiting. Some difficulty with balance. Follow these instructions at home: For the time period you were told by your health care provider:     Rest as needed. Do not participate in activities where you could fall or become injured. Do not drive or use machinery. Do not drink  alcohol. Do not take sleeping pills or medicines that cause drowsiness. Do not make important decisions or sign legal documents. Do not take care of children on your own. Eating and drinking Follow the diet that is recommended by your health care provider. Drink enough fluid to keep your urine pale yellow. If you vomit: Drink water, juice, or soup when you can drink without vomiting. Make sure you have little or no nausea before eating solid foods. General instructions Have a responsible adult stay with you for the time you are told. It is important to have someone help care for you until you are awake and alert. Take over-the-counter and prescription medicines only as told by your health care provider. If you have sleep apnea, surgery and certain medicines can increase your risk for breathing problems. Follow instructions from your health care provider about wearing your sleep device: Anytime you are sleeping, including during daytime naps. While taking prescription pain medicines, sleeping medicines, or medicines that make you drowsy. Avoid smoking. Keep all follow-up visits as told by your health care provider. This is important. Contact a health care provider if: You keep feeling nauseous or you keep vomiting. You feel light-headed. You are still sleepy or having trouble with balance after 24 hours. You develop a rash. You have a fever. You have redness or swelling around the IV site. Get help right away if: You have trouble breathing. You have new-onset confusion at home. Summary For several hours after your procedure, you may feel tired. You may also be forgetful and have poor judgment. Have a responsible adult stay with you for the time you are told. It is important to have someone help care for you until you are awake and alert. Rest as told. Do not drive or operate machinery. Do not drink alcohol or take sleeping pills. Get help right away if you have trouble breathing, or if  you suddenly become confused. This information is not intended to replace advice given to you by your health care provider. Make sure you discuss any questions you have with your health care provider. Document Revised: 08/05/2021 Document Reviewed: 08/03/2019 Elsevier Patient Education  [place].

## 2022-03-20 ENCOUNTER — Encounter (HOSPITAL_COMMUNITY)
Admission: RE | Admit: 2022-03-20 | Discharge: 2022-03-20 | Disposition: A | Discharge status: Home / Self Care | Payer: Medicare Other | Source: Ambulatory Visit | Attending: Internal Medicine | Admitting: Internal Medicine

## 2022-03-20 ENCOUNTER — Encounter (HOSPITAL_COMMUNITY): Payer: Self-pay

## 2022-03-20 ENCOUNTER — Encounter (HOSPITAL_COMMUNITY): Payer: Self-pay | Admitting: Anesthesiology

## 2022-03-20 DIAGNOSIS — K7030 Alcoholic cirrhosis of liver without ascites: Secondary | ICD-10-CM

## 2022-03-20 DIAGNOSIS — Z720 Tobacco use: Secondary | ICD-10-CM

## 2022-03-20 DIAGNOSIS — F17209 Nicotine dependence, unspecified, with unspecified nicotine-induced disorders: Secondary | ICD-10-CM

## 2022-03-20 DIAGNOSIS — B182 Chronic viral hepatitis C: Secondary | ICD-10-CM

## 2022-03-20 DIAGNOSIS — F17200 Nicotine dependence, unspecified, uncomplicated: Secondary | ICD-10-CM

## 2022-03-23 ENCOUNTER — Ambulatory Visit (HOSPITAL_COMMUNITY): Admission: RE | Admit: 2022-03-23 | Payer: Medicare Other | Source: Home / Self Care

## 2022-03-23 ENCOUNTER — Encounter (HOSPITAL_COMMUNITY): Admission: RE | Payer: Self-pay | Source: Home / Self Care

## 2022-03-23 ENCOUNTER — Telehealth: Payer: Self-pay | Admitting: *Deleted

## 2022-03-23 DIAGNOSIS — F17200 Nicotine dependence, unspecified, uncomplicated: Secondary | ICD-10-CM

## 2022-03-23 DIAGNOSIS — B182 Chronic viral hepatitis C: Secondary | ICD-10-CM

## 2022-03-23 DIAGNOSIS — Z720 Tobacco use: Secondary | ICD-10-CM

## 2022-03-23 DIAGNOSIS — E559 Vitamin D deficiency, unspecified: Secondary | ICD-10-CM

## 2022-03-23 DIAGNOSIS — F17209 Nicotine dependence, unspecified, with unspecified nicotine-induced disorders: Secondary | ICD-10-CM

## 2022-03-23 DIAGNOSIS — K4090 Unilateral inguinal hernia, without obstruction or gangrene, not specified as recurrent: Secondary | ICD-10-CM

## 2022-03-23 DIAGNOSIS — D696 Thrombocytopenia, unspecified: Secondary | ICD-10-CM

## 2022-03-23 DIAGNOSIS — F1020 Alcohol dependence, uncomplicated: Secondary | ICD-10-CM

## 2022-03-23 DIAGNOSIS — M810 Age-related osteoporosis without current pathological fracture: Secondary | ICD-10-CM

## 2022-03-23 DIAGNOSIS — R790 Abnormal level of blood mineral: Secondary | ICD-10-CM

## 2022-03-23 DIAGNOSIS — Z86010 Personal history of colonic polyps: Secondary | ICD-10-CM

## 2022-03-23 DIAGNOSIS — K7030 Alcoholic cirrhosis of liver without ascites: Secondary | ICD-10-CM

## 2022-03-23 DIAGNOSIS — Z9181 History of falling: Secondary | ICD-10-CM

## 2022-03-23 DIAGNOSIS — E785 Hyperlipidemia, unspecified: Secondary | ICD-10-CM

## 2022-03-23 DIAGNOSIS — D649 Anemia, unspecified: Secondary | ICD-10-CM

## 2022-03-23 DIAGNOSIS — F03A0 Unspecified dementia, mild, without behavioral disturbance, psychotic disturbance, mood disturbance, and anxiety: Secondary | ICD-10-CM

## 2022-03-23 DIAGNOSIS — S7292XA Unspecified fracture of left femur, initial encounter for closed fracture: Secondary | ICD-10-CM

## 2022-03-23 SURGERY — COLONOSCOPY WITH PROPOFOL
Anesthesia: Monitor Anesthesia Care

## 2022-03-23 NOTE — Telephone Encounter (Signed)
-----   Message from Jacqulynn Cadet, RN sent at 03/20/2022  4:08 PM EDT ----- Regarding: No Show Just an FYI.Kyle KitchenMarland KitchenDaine Floras didn't show up for his PAT appointment today and he is on the schedule for Monday for EGD/Colonoscopy

## 2022-03-23 NOTE — Telephone Encounter (Signed)
LMTCB

## 2022-03-24 NOTE — Telephone Encounter (Signed)
Spoke with daughter Kyle Hammond. Patient isn't wanting to reschedule procedure at this time. They will call back when they are ready. FYI to Dr. Abbey Chatters

## 2022-03-30 ENCOUNTER — Inpatient Hospital Stay (HOSPITAL_COMMUNITY): Payer: Medicare Other | Attending: Hematology

## 2022-04-06 ENCOUNTER — Ambulatory Visit (HOSPITAL_COMMUNITY): Payer: Medicare Other | Admitting: Hematology

## 2022-05-08 ENCOUNTER — Encounter: Payer: Self-pay | Admitting: Family Medicine

## 2022-05-08 ENCOUNTER — Ambulatory Visit (INDEPENDENT_AMBULATORY_CARE_PROVIDER_SITE_OTHER): Payer: Medicare Other | Admitting: Family Medicine

## 2022-05-08 VITALS — BP 120/85 | HR 98 | Temp 98.3°F | Ht 66.0 in | Wt 127.0 lb

## 2022-05-08 DIAGNOSIS — D696 Thrombocytopenia, unspecified: Secondary | ICD-10-CM

## 2022-05-08 DIAGNOSIS — I10 Essential (primary) hypertension: Secondary | ICD-10-CM | POA: Diagnosis not present

## 2022-05-08 DIAGNOSIS — R55 Syncope and collapse: Secondary | ICD-10-CM

## 2022-05-08 DIAGNOSIS — Z23 Encounter for immunization: Secondary | ICD-10-CM

## 2022-05-08 DIAGNOSIS — B182 Chronic viral hepatitis C: Secondary | ICD-10-CM

## 2022-05-08 DIAGNOSIS — F1097 Alcohol use, unspecified with alcohol-induced persisting dementia: Secondary | ICD-10-CM

## 2022-05-08 DIAGNOSIS — K7030 Alcoholic cirrhosis of liver without ascites: Secondary | ICD-10-CM | POA: Diagnosis not present

## 2022-05-08 NOTE — Progress Notes (Signed)
Established Patient Office Visit  Subjective   Patient ID: Kyle Hammond, male    DOB: December 13, 1955  Age: 66 y.o. MRN: [id number]  Chief Complaint  Patient presents with   Medical Management of Chronic Issues    HPI Marguerite Olea is here today with his daughter for a chronic follow up. He reports feeling great today and has no concerns. He states that he hasn't felt this great in a long time. He did try prozac but stopped taking this as it made him sick to his stomach.   He has a liver MRI done on 02/27/22. This showed mulitple liver nodules that were intermediate suspicion for hepatocellular carcinoma. GI placed a referral to the liver clinic for further evaluation and recommeneded a reapeat MRI in 3 months. Unfortunately he has not follow up with either the liver clinic or GI since.  He continues to drink alcohol and does not plan to stop.   He reports a syncopal episode about 2 weeks ago. He reports that he was walking along the road when his vision became blurry. The next thing he recalls is waking up in the road with a man standing over him. This man helped him back to his home and helped him lay down. He denies any other precipitating symptoms. He reports that he felt back to normal after it occurred. It was not evaluated after this episode. He denies similar episodes since. He does admit that he does not eat well or stay well hydrated.   He declines labs today or any additional work up today.   Past Medical History:  Diagnosis Date   Cataract    Hepatitis C       Review of Systems  Constitutional:  Negative for chills, diaphoresis, fever, malaise/fatigue and weight loss.  HENT:  Negative for sore throat.   Eyes:  Positive for blurred vision (see HPI). Negative for double vision and photophobia.  Respiratory:  Negative for cough, shortness of breath and wheezing.   Cardiovascular:  Negative for chest pain, palpitations, orthopnea, claudication, leg swelling and PND.   Gastrointestinal:  Negative for abdominal pain, blood in stool, constipation, diarrhea, heartburn, melena, nausea and vomiting.  Genitourinary:  Negative for dysuria.  Skin:  Negative for rash.  Neurological:  Positive for loss of consciousness (see HPI). Negative for dizziness, tingling, tremors, sensory change, speech change, focal weakness, seizures, weakness and headaches.  Psychiatric/Behavioral:  Positive for substance abuse (alcohol). Negative for hallucinations and suicidal ideas. The patient is nervous/anxious. The patient does not have insomnia.       Objective:     BP 120/85   Pulse 98   Temp 98.3 F (36.8 C) (Temporal)   Ht 5' 6" (1.676 m)   Wt 127 lb (57.6 kg)   SpO2 98%   BMI 20.50 kg/m  Wt Readings from Last 3 Encounters:  05/08/22 127 lb (57.6 kg)  02/04/22 121 lb 8 oz (55.1 kg)  01/06/22 125 lb 6.4 oz (56.9 kg)      Physical Exam Vitals and nursing note reviewed.  Constitutional:      General: He is not in acute distress.    Appearance: He is not toxic-appearing or diaphoretic.  HENT:     Head: Normocephalic and atraumatic.  Eyes:     Extraocular Movements: Extraocular movements intact.     Pupils: Pupils are equal, round, and reactive to light.  Neck:     Vascular: No carotid bruit.  Cardiovascular:     Rate and Rhythm:  Normal rate and regular rhythm.     Heart sounds: Normal heart sounds. No murmur heard. Pulmonary:     Effort: Pulmonary effort is normal. No respiratory distress.     Breath sounds: Normal breath sounds.  Abdominal:     General: Bowel sounds are normal. There is no distension.     Palpations: Abdomen is soft. There is no mass.     Tenderness: There is no abdominal tenderness. There is no guarding or rebound.     Hernia: No hernia is present.  Musculoskeletal:        General: No swelling or tenderness.     Cervical back: Neck supple.     Right lower leg: No edema.     Left lower leg: No edema.  Skin:    General: Skin is warm  and dry.  Neurological:     General: No focal deficit present.     Mental Status: He is alert and oriented to person, place, and time.     Sensory: No sensory deficit.     Motor: No weakness.     Gait: Gait normal.  Psychiatric:        Mood and Affect: Mood normal.        Behavior: Behavior normal.      No results found for any visits on 05/08/22.  Last CBC Lab Results  Component Value Date   WBC 3.0 (L) 12/24/2021   HGB 14.1 12/24/2021   HCT 40.7 12/24/2021   MCV 98 (H) 12/24/2021   MCH 34.1 (H) 12/24/2021   RDW 12.1 12/24/2021   PLT 104 (L) 16/94/5038   Last metabolic panel Lab Results  Component Value Date   GLUCOSE 88 01/06/2022   NA 139 01/06/2022   K 3.5 01/06/2022   CL 100 01/06/2022   CO2 27 01/06/2022   BUN 9 01/06/2022   CREATININE 0.76 01/06/2022   EGFR 100 01/06/2022   CALCIUM 8.7 01/06/2022   PROT 9.4 (H) 01/06/2022   ALBUMIN 4.1 12/10/2021   LABGLOB 4.2 12/10/2021   AGRATIO 1.0 (L) 12/10/2021   BILITOT 1.8 (H) 01/06/2022   ALKPHOS 85 12/10/2021   AST 354 (H) 01/06/2022   ALT 178 (H) 01/06/2022   ANIONGAP 4 (L) 06/23/2016   Last lipids Lab Results  Component Value Date   CHOL 197 12/10/2021   HDL 82 12/10/2021   LDLCALC 99 12/10/2021   TRIG 89 12/10/2021   CHOLHDL 2.4 12/10/2021   Last hemoglobin A1c No results found for: "HGBA1C" Last thyroid functions Lab Results  Component Value Date   TSH 4.980 (H) 12/10/2021   T4TOTAL 6.6 12/10/2021      The 10-year ASCVD risk score (Arnett DK, et al., 2019) is: 11.1%    Assessment & Plan:   Kay was seen today for medical management of chronic issues.  Diagnoses and all orders for this visit:  Primary hypertension BP well controlled today without medication. Declines labs today.   Alcoholic cirrhosis of liver without ascites (HCC) Chronic hepatitis C without hepatic coma ([place]) Has not followed up with Liver clinic as referred by GI. Discussed need to follow up with liver clinic and  GI. Asymptomatic today.   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 Declined labs today.   Syncope, unspecified syncope type Unsure of etiology, however suspect this is likely related to malnutrition, dehydration, and alcohol intake. Denies cardiac or neurological symptoms. Declined further work up today. Denies need for emergent  elevation for syncope.   Need for vaccination -     Zoster Recombinant ([facility] )  Return in about 3 months (around 08/08/2022) for chronic follow up.   The patient indicates understanding of these issues and agrees with the plan.  Gwenlyn Perking, FNP

## 2022-05-08 NOTE — Patient Instructions (Signed)
Cirrhosis  Cirrhosis is long-term (chronic) liver injury. The liver is the body's largest internal organ, and it performs many functions. It converts food into energy, removes toxic material from the blood, makes important proteins, and absorbs necessary vitamins from food. In cirrhosis, healthy liver cells are replaced by scar tissue. This prevents blood from flowing through the liver and makes it difficult for the liver to complete its functions. What are the causes? Common causes of this condition are hepatitis C and long-term alcohol abuse. Other causes include: Nonalcoholic fatty liver disease (NAFLD). This happens when fat is deposited in the liver by causes other than alcohol. Hepatitis B infection. Autoimmune hepatitis. In this condition, the body's defense system (immune system) mistakenly attacks the liver cells, causing inflammation. Diseases that cause blockage of ducts inside the liver. Inherited liver diseases, such as hemochromatosis. This is one of the most common inherited liver diseases. In this disease, deposits of iron collect in the liver and other organs. Reactions to certain long-term medicines, such as amiodarone, a heart medicine. Parasitic infections. These include schistosomiasis, which is caused by a flatworm. Long-term contact to certain toxins. These toxins include certain organic solvents, such as toluene and chloroform. What increases the risk? You are more likely to develop this condition if: You have certain types of viral hepatitis. You abuse alcohol, especially if you are male. You are overweight. You use IV drugs and share needles. You have unprotected sex with someone who has viral hepatitis. What are the signs or symptoms? You may not have any signs and symptoms at first. Symptoms may not develop until the damage to your liver starts to get worse. Early symptoms may include: Weakness and tiredness (fatigue). Changes in sleep patterns or having trouble  sleeping. Itchiness. Tenderness in the right-upper part of your abdomen. Weight loss and muscle loss. Nausea. Loss of appetite. Later symptoms may include: Fatigue or weakness that is getting worse. Yellow skin and eyes (jaundice). Buildup of fluid in the abdomen (ascites). You may notice that your clothes are tight around your waist. Weight gain and swelling of the feet and ankles (edema). Trouble breathing. Easy bruising and bleeding. Vomiting blood, or black or bloody stool. Mental confusion. How is this diagnosed? Your health care provider may suspect cirrhosis based on your symptoms and medical history, especially if you have other medical conditions or a history of alcohol abuse. Your health care provider will do a physical exam to feel your liver and to check for signs of cirrhosis. Tests may include: Blood tests to check: For hepatitis B or C. Kidney function. Liver function. Imaging tests such as: MRI or CT scan to look for changes seen in advanced cirrhosis. Ultrasound to see if normal liver tissue is being replaced by scar tissue. A procedure in which a long needle is used to take a sample of liver tissue to be checked in a lab (biopsy). Liver biopsy can confirm the diagnosis of cirrhosis. How is this treated? Treatment for this condition depends on how damaged your liver is and what caused the damage. It may include treating the symptoms of cirrhosis, or treating the underlying causes to slow the damage. Treatment may include: Making lifestyle changes, such as: Eating a healthy diet. You may need to work with your health care provider or a dietitian to develop an eating plan. Restricting salt intake. Maintaining a healthy weight. Not abusing drugs or alcohol. Taking medicines to: Treat liver infections or other infections. Control itching. Reduce fluid buildup. Reduce certain blood   toxins. Reduce risk of bleeding from enlarged blood vessels in the stomach or esophagus  (varices). Liver transplant. In this procedure, a liver from a donor is used to replace your diseased liver. This is done if cirrhosis has caused liver failure. Other treatments and procedures may be done depending on the problems that you get from cirrhosis. Common problems include liver-related kidney failure (hepatorenal syndrome). Follow these instructions at home:  Take medicines only as told by your health care provider. Do not use medicines that are toxic to your liver. Ask your health care provider before taking any new medicines, including over-the-counter medicines such as NSAIDs. Rest as needed. Eat a well-balanced diet. Limit your salt or water intake, if your health care provider asks you to do this. Do not drink alcohol. This is especially important if you routinely take acetaminophen. Keep all follow-up visits. This is important. Contact a health care provider if you: Have fatigue or weakness that is getting worse. Develop swelling of the hands, feet, or legs, or a buildup of fluid in the abdomen (ascites). Have a fever or chills. Develop loss of appetite. Have nausea or vomiting. Develop jaundice. Develop easy bruising or bleeding. Get help right away if you: Vomit bright red blood or a material that looks like coffee grounds. Have blood in your stools. Notice that your stools appear black and tarry. Become confused. Have chest pain or trouble breathing. These symptoms may represent a serious problem that is an emergency. Do not wait to see if the symptoms will go away. Get medical help right away. Call your local emergency services (911 in the [place] Do not drive yourself to the hospital. Summary Cirrhosis is chronic liver injury. Common causes are hepatitis C and long-term alcohol abuse. Tests used to diagnose cirrhosis include blood tests, imaging tests, and liver biopsy. Treatment for this condition involves treating the underlying cause. Avoid alcohol, drugs, salt,  and medicines that may damage your liver. Get help right away if you vomit bright red blood or a material that looks like coffee grounds. This information is not intended to replace advice given to you by your health care provider. Make sure you discuss any questions you have with your health care provider. Document Revised: 06/13/2020 Document Reviewed: 06/13/2020 Elsevier Patient Education  2023 [employer]  

## 2022-05-22 ENCOUNTER — Encounter: Payer: Self-pay | Admitting: *Deleted

## 2022-06-09 ENCOUNTER — Encounter: Payer: Self-pay | Admitting: *Deleted

## 2022-07-04 DIAGNOSIS — R000 Tachycardia, unspecified: Secondary | ICD-10-CM | POA: Diagnosis not present

## 2022-07-04 DIAGNOSIS — J9811 Atelectasis: Secondary | ICD-10-CM | POA: Diagnosis not present

## 2022-07-04 DIAGNOSIS — J40 Bronchitis, not specified as acute or chronic: Secondary | ICD-10-CM | POA: Diagnosis not present

## 2022-07-04 DIAGNOSIS — J90 Pleural effusion, not elsewhere classified: Secondary | ICD-10-CM | POA: Diagnosis not present

## 2022-07-04 DIAGNOSIS — Z20822 Contact with and (suspected) exposure to covid-19: Secondary | ICD-10-CM | POA: Diagnosis not present

## 2022-07-04 DIAGNOSIS — R059 Cough, unspecified: Secondary | ICD-10-CM | POA: Diagnosis not present

## 2022-07-08 ENCOUNTER — Ambulatory Visit (INDEPENDENT_AMBULATORY_CARE_PROVIDER_SITE_OTHER): Payer: Medicare Other | Admitting: Family Medicine

## 2022-07-08 ENCOUNTER — Encounter: Payer: Self-pay | Admitting: Family Medicine

## 2022-07-08 VITALS — BP 102/75 | HR 113 | Temp 98.3°F | Ht 66.0 in | Wt 132.1 lb

## 2022-07-08 DIAGNOSIS — B182 Chronic viral hepatitis C: Secondary | ICD-10-CM

## 2022-07-08 DIAGNOSIS — K7030 Alcoholic cirrhosis of liver without ascites: Secondary | ICD-10-CM

## 2022-07-08 DIAGNOSIS — R000 Tachycardia, unspecified: Secondary | ICD-10-CM

## 2022-07-08 DIAGNOSIS — J189 Pneumonia, unspecified organism: Secondary | ICD-10-CM | POA: Diagnosis not present

## 2022-07-08 DIAGNOSIS — E440 Moderate protein-calorie malnutrition: Secondary | ICD-10-CM

## 2022-07-08 DIAGNOSIS — I10 Essential (primary) hypertension: Secondary | ICD-10-CM | POA: Diagnosis not present

## 2022-07-08 DIAGNOSIS — R627 Adult failure to thrive: Secondary | ICD-10-CM | POA: Diagnosis not present

## 2022-07-08 DIAGNOSIS — F1097 Alcohol use, unspecified with alcohol-induced persisting dementia: Secondary | ICD-10-CM

## 2022-07-08 DIAGNOSIS — D696 Thrombocytopenia, unspecified: Secondary | ICD-10-CM | POA: Diagnosis not present

## 2022-07-08 NOTE — Progress Notes (Unsigned)
Acute Office Visit  Subjective:     Patient ID: Kyle Hammond, male    DOB: Jun 17, 1956, 66 y.o.   MRN: [id number]  Chief Complaint  Patient presents with   Cough    HPI Here with daughter today. Patient is in today for cough and fast heart rate. Reports cough has been present for 1 week. It is productive. He also has nasal congestion and shortness of breath with activity. Denies chest pain, wheezing, or fever. Reports fast heart rate has been intermittent. Denies palpitations, dizziness, weakness, or lightheaded. He has had some edema in both of his lower extremities for the last month. He was seen for this yesterday at [facility]. He had an EKG done that showed ST with PVCs. He has a copy of it with him today. He also had a chest CXR that showed small right pleural effusion with right basilar atelectasis. He was prescribed doxycyline, albuterol, and prednisone. He was also given lasix. He has not taken any of the medication. He also has not eating today and only had a little Gatorade to drink. His daughter is worried today as he seems to have really gone downhill recently. He has not been following up with GI. He is not interested in ETOH cessation and therefore has not been interested in treating his chronic Hep C and cirrhosis. He denies abdominal pain, nausea, vomiting, increased confusion, or weakness. He currently is residing at home alone. His daughter has been looking into a way to move him closer to her so that she can help provide care for him as she is currently 45 minutes away from him. He states that he does not wish to be a burden to anyone and is ready to go. Denies SI.   ROS As per HPI.      Objective:    BP 102/75   Pulse (!) 113   Temp 98.3 F (36.8 C) (Temporal)   Ht '5\' 6"'  (1.676 m)   Wt 132 lb 2 oz (59.9 kg)   SpO2 97%   BMI 21.33 kg/m  BP Readings from Last 3 Encounters:  07/08/22 102/75  05/08/22 120/85  02/04/22 113/71   Wt Readings from Last 3 Encounters:   07/08/22 132 lb 2 oz (59.9 kg)  05/08/22 127 lb (57.6 kg)  02/04/22 121 lb 8 oz (55.1 kg)     Physical Exam Vitals and nursing note reviewed.  Constitutional:      General: He is not in acute distress.    Appearance: He is underweight. He is ill-appearing (chronically ill). He is not toxic-appearing or diaphoretic.  HENT:     Head: Normocephalic and atraumatic.  Eyes:     General: Scleral icterus present.  Cardiovascular:     Rate and Rhythm: Regular rhythm. Tachycardia present.     Heart sounds: Normal heart sounds. No murmur heard. Pulmonary:     Effort: Pulmonary effort is normal. No respiratory distress.     Breath sounds: Examination of the right-middle field reveals rhonchi. Examination of the right-lower field reveals rhonchi. Rhonchi present.  Abdominal:     General: Bowel sounds are normal. There is no distension.     Palpations: Abdomen is soft. There is hepatomegaly. There is no fluid wave or mass.     Tenderness: There is abdominal tenderness in the right upper quadrant. There is no guarding or rebound.  Musculoskeletal:     Cervical back: No rigidity.     Right lower leg: No tenderness. 2+ Pitting Edema  present.     Left lower leg: No tenderness. 2+ Pitting Edema present.  Skin:    General: Skin is warm and dry.     Coloration: Skin is jaundiced (mild).  Neurological:     Mental Status: He is alert and oriented to person, place, and time. Mental status is at baseline.  Psychiatric:        Mood and Affect: Mood normal.        Speech: Speech normal.        Behavior: Behavior normal.     No results found for any visits on 07/08/22.      Assessment & Plan:   Erik was seen today for cough.  Diagnoses and all orders for this visit:  Pneumonia of right lower lobe due to infectious organism Discussed to take medications as prescribed by urgent care. His daughter will have him stay with her tonight to help make sure he takes his medications.    Tachycardia Reviewed EKG from UC. Will check electrolyte panel today. Suspect that his is also dehydrated as he has had minimal fluid intake. Discussed oral rehydration.  Primary hypertension BP is soft today. Not currently on medication. -     Amb Referral to Palliative Care  Alcoholic cirrhosis of liver without ascites ([place]) Chronic hepatitis C without hepatic coma ([facility]) Has seen GI but has declined treatment and has stopped following up as he is not interested in ETOH cessation. This seems to have worsened quickly. Will check labs as below. Discussed to take lasix as prescribed from UC for fluid. We had a discussion today about palliative care and his goals for his care. I discussed that he likely has less than 6 month without treatment. He is interested in a palliative care referral.  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 He has stopped follow up with oncology.  -     CBC with Differential/Platelet -     Amb Referral to Palliative Care  Moderate protein-calorie malnutrition ([place]) Adult failure to thrive Mild dementia associated with alcoholism, with anxiety ([place]) Minima fluid and food intake. He is quite thin. He has gained weight since his last visit but this is due to fluid buildup. Referral to palliative care placed.  -     Magnesium -     Vitamin B1 -     Vitamin B12 -     Folate -     CBC with Differential/Platelet -     CMP14+EGFR -     Amb Referral to Palliative Care   Return if symptoms worsen or fail to improve, for will determine follow up pending labs results.  The patient indicates understanding of these issues and agrees with the plan.  Gwenlyn Perking, FNP

## 2022-07-08 NOTE — Patient Instructions (Signed)
Community-Acquired Pneumonia, Adult Pneumonia is a lung infection that causes inflammation and the buildup of mucus and fluids in the lungs. This may cause coughing and difficulty breathing. Community-acquired pneumonia is pneumonia that develops in people who are not, and have not recently been, in a hospital or other health care facility. Usually, pneumonia develops as a result of an illness that is caused by a virus, such as the common cold and the flu (influenza). It can also be caused by bacteria or fungi. While the common cold and influenza can pass from person to person (are contagious), pneumonia itself is not considered contagious. What are the causes? This condition may be caused by: Viruses. Bacteria. Fungi. What increases the risk? The following factors may make you more likely to develop this condition: Being over age 65 or having certain medical conditions, such as: A long-term (chronic) disease, such as: chronic obstructive pulmonary disease (COPD), asthma, heart failure, diabetes, or kidney disease. A condition that increases the risk of breathing in (aspirating) mucus and other fluids from your mouth and nose. A weakened body defense system (immune system). Having had your spleen removed (splenectomy). The spleen is the organ that helps fight germs and infections. Not cleaning your teeth and gums well (poor dental hygiene). Using tobacco products. Traveling to places where germs that cause pneumonia are present or being near certain animals or animal habitats that could have germs that cause pneumonia. What are the signs or symptoms? Symptoms of this condition include: A dry cough or a wet (productive) cough. A fever, sweating, or chills. Chest pain, especially when breathing deeply or coughing. Fast breathing, difficulty breathing, or shortness of breath. Tiredness (fatigue) and muscle aches. How is this diagnosed? This condition may be diagnosed based on your medical  history or a physical exam. You may also have tests, including: Imaging, such as a chest X-ray or lung ultrasound. Tests of: The level of oxygen and other gases in your blood. Mucus from your lungs (sputum). Fluid around your lungs (pleural fluid). Your urine. How is this treated? Treatment for this condition depends on many factors, such as the cause of your pneumonia, your medicines, and other medical conditions that you have. For most adults, pneumonia may be treated at home. In some cases, treatment must happen in a hospital and may include: Medicines that are given by mouth (orally) or through an IV, including: Antibiotic medicines, if bacteria caused the pneumonia. Medicines that kill viruses (antiviral medicines), if a virus caused the pneumonia. Oxygen therapy. Severe pneumonia, although rare, may require the following treatments: Mechanical ventilation.This procedure uses a machine to help you breathe if you cannot breathe well on your own or maintain a safe level of blood oxygen. Thoracentesis. This procedure removes any buildup of pleural fluid to help with breathing. Follow these instructions at home:  Medicines Take over-the-counter and prescription medicines only as told by your health care provider. Take cough medicine only if you have trouble sleeping. Cough medicine can prevent your body from removing mucus from your lungs. If you were prescribed antibiotics, take them as told by your health care provider. Do not stop taking the antibiotic even if you start to feel better. Lifestyle     Do not drink alcohol. Do not use any products that contain nicotine or tobacco. These products include cigarettes, chewing tobacco, and vaping devices, such as e-cigarettes. If you need help quitting, ask your health care provider. Eat a healthy diet. This includes plenty of vegetables, fruits, whole grains, low-fat   dairy products, and lean protein. General instructions Rest a lot and  get at least 8 hours of sleep each night. Sleep in a partly upright position at night. Place a few pillows under your head or sleep in a reclining chair. Return to your normal activities as told by your health care provider. Ask your health care provider what activities are safe for you. Drink enough fluid to keep your urine pale yellow. This helps to thin the mucus in your lungs. If your throat is sore, gargle with a mixture of salt and water 3-4 times a day or as needed. To make salt water, completely dissolve -1 tsp (3-6 g) of salt in 1 cup (237 mL) of warm water. Keep all follow-up visits. How is this prevented? You can lower your risk of developing community-acquired pneumonia by: Getting the pneumonia vaccine. There are different types and schedules of pneumonia vaccines. Ask your health care provider which option is best for you. Consider getting the pneumonia vaccine if: You are older than 65 years of age. You are 19-65 years of age and are receiving cancer treatment, have chronic lung disease, or have other medical conditions that affect your immune system. Ask your health care provider if this applies to you. Getting your influenza vaccine every year. Ask your health care provider which type of vaccine is best for you. Getting regular dental checkups. Washing your hands often with soap and water for at least 20 seconds. If soap and water are not available, use hand sanitizer. Contact a health care provider if: You have a fever. You have trouble sleeping because you cannot control your cough with cough medicine. Get help right away if: Your shortness of breath becomes worse. Your chest pain increases. Your sickness becomes worse, especially if you are an older adult or have a weak immune system. You cough up blood. These symptoms may be an emergency. Get help right away. Call 911. Do not wait to see if the symptoms will go away. Do not drive yourself to the  hospital. Summary Pneumonia is an infection of the lungs. Community-acquired pneumonia develops in people who have not been in the hospital. It can be caused by bacteria, viruses, or fungi. This condition may be treated with antibiotics or antiviral medicines. Severe pneumonia may require a hospital stay and treatment to help with breathing. This information is not intended to replace advice given to you by your health care provider. Make sure you discuss any questions you have with your health care provider. Document Revised: 10/29/2021 Document Reviewed: 10/29/2021 Elsevier Patient Education  2023 [employer].  

## 2022-07-09 ENCOUNTER — Other Ambulatory Visit: Payer: Self-pay | Admitting: Family Medicine

## 2022-07-09 ENCOUNTER — Emergency Department (HOSPITAL_COMMUNITY): Payer: Medicare Other

## 2022-07-09 ENCOUNTER — Encounter (HOSPITAL_COMMUNITY): Payer: Self-pay | Admitting: Emergency Medicine

## 2022-07-09 ENCOUNTER — Inpatient Hospital Stay (HOSPITAL_COMMUNITY)
Admission: EM | Admit: 2022-07-09 | Discharge: 2022-07-16 | DRG: 432 | Disposition: A | Discharge status: Skilled Nursing Facility | Payer: Medicare Other | Source: Ambulatory Visit | Attending: Internal Medicine | Admitting: Internal Medicine

## 2022-07-09 ENCOUNTER — Other Ambulatory Visit: Payer: Self-pay

## 2022-07-09 DIAGNOSIS — K4090 Unilateral inguinal hernia, without obstruction or gangrene, not specified as recurrent: Secondary | ICD-10-CM | POA: Diagnosis not present

## 2022-07-09 DIAGNOSIS — E559 Vitamin D deficiency, unspecified: Secondary | ICD-10-CM | POA: Diagnosis not present

## 2022-07-09 DIAGNOSIS — E871 Hypo-osmolality and hyponatremia: Secondary | ICD-10-CM | POA: Diagnosis not present

## 2022-07-09 DIAGNOSIS — B1920 Unspecified viral hepatitis C without hepatic coma: Secondary | ICD-10-CM | POA: Diagnosis not present

## 2022-07-09 DIAGNOSIS — D649 Anemia, unspecified: Secondary | ICD-10-CM | POA: Diagnosis not present

## 2022-07-09 DIAGNOSIS — K7210 Chronic hepatic failure without coma: Secondary | ICD-10-CM | POA: Diagnosis not present

## 2022-07-09 DIAGNOSIS — F17210 Nicotine dependence, cigarettes, uncomplicated: Secondary | ICD-10-CM | POA: Diagnosis not present

## 2022-07-09 DIAGNOSIS — R791 Abnormal coagulation profile: Secondary | ICD-10-CM | POA: Diagnosis present

## 2022-07-09 DIAGNOSIS — R54 Age-related physical debility: Secondary | ICD-10-CM | POA: Diagnosis present

## 2022-07-09 DIAGNOSIS — F10231 Alcohol dependence with withdrawal delirium: Secondary | ICD-10-CM | POA: Diagnosis present

## 2022-07-09 DIAGNOSIS — K7010 Alcoholic hepatitis without ascites: Secondary | ICD-10-CM | POA: Diagnosis present

## 2022-07-09 DIAGNOSIS — E785 Hyperlipidemia, unspecified: Secondary | ICD-10-CM | POA: Diagnosis present

## 2022-07-09 DIAGNOSIS — I5021 Acute systolic (congestive) heart failure: Secondary | ICD-10-CM | POA: Diagnosis not present

## 2022-07-09 DIAGNOSIS — Z8249 Family history of ischemic heart disease and other diseases of the circulatory system: Secondary | ICD-10-CM | POA: Diagnosis not present

## 2022-07-09 DIAGNOSIS — I471 Supraventricular tachycardia, unspecified: Secondary | ICD-10-CM | POA: Diagnosis not present

## 2022-07-09 DIAGNOSIS — Z66 Do not resuscitate: Secondary | ICD-10-CM | POA: Diagnosis not present

## 2022-07-09 DIAGNOSIS — Z808 Family history of malignant neoplasm of other organs or systems: Secondary | ICD-10-CM | POA: Diagnosis not present

## 2022-07-09 DIAGNOSIS — I959 Hypotension, unspecified: Secondary | ICD-10-CM | POA: Diagnosis not present

## 2022-07-09 DIAGNOSIS — K7031 Alcoholic cirrhosis of liver with ascites: Secondary | ICD-10-CM | POA: Diagnosis not present

## 2022-07-09 DIAGNOSIS — K7290 Hepatic failure, unspecified without coma: Secondary | ICD-10-CM | POA: Diagnosis present

## 2022-07-09 DIAGNOSIS — D696 Thrombocytopenia, unspecified: Secondary | ICD-10-CM | POA: Diagnosis present

## 2022-07-09 DIAGNOSIS — R2689 Other abnormalities of gait and mobility: Secondary | ICD-10-CM | POA: Diagnosis not present

## 2022-07-09 DIAGNOSIS — M810 Age-related osteoporosis without current pathological fracture: Secondary | ICD-10-CM | POA: Diagnosis not present

## 2022-07-09 DIAGNOSIS — K7040 Alcoholic hepatic failure without coma: Principal | ICD-10-CM | POA: Diagnosis present

## 2022-07-09 DIAGNOSIS — F17209 Nicotine dependence, unspecified, with unspecified nicotine-induced disorders: Secondary | ICD-10-CM | POA: Diagnosis present

## 2022-07-09 DIAGNOSIS — I472 Ventricular tachycardia, unspecified: Secondary | ICD-10-CM | POA: Diagnosis present

## 2022-07-09 DIAGNOSIS — E876 Hypokalemia: Secondary | ICD-10-CM | POA: Diagnosis present

## 2022-07-09 DIAGNOSIS — K7030 Alcoholic cirrhosis of liver without ascites: Secondary | ICD-10-CM | POA: Diagnosis present

## 2022-07-09 DIAGNOSIS — Z91148 Patient's other noncompliance with medication regimen for other reason: Secondary | ICD-10-CM | POA: Diagnosis not present

## 2022-07-09 DIAGNOSIS — Z7189 Other specified counseling: Secondary | ICD-10-CM | POA: Diagnosis not present

## 2022-07-09 DIAGNOSIS — D72819 Decreased white blood cell count, unspecified: Secondary | ICD-10-CM | POA: Diagnosis present

## 2022-07-09 DIAGNOSIS — Z86010 Personal history of colonic polyps: Secondary | ICD-10-CM | POA: Diagnosis not present

## 2022-07-09 DIAGNOSIS — Z9181 History of falling: Secondary | ICD-10-CM | POA: Diagnosis not present

## 2022-07-09 DIAGNOSIS — J8410 Pulmonary fibrosis, unspecified: Secondary | ICD-10-CM | POA: Diagnosis not present

## 2022-07-09 DIAGNOSIS — R790 Abnormal level of blood mineral: Secondary | ICD-10-CM | POA: Diagnosis not present

## 2022-07-09 DIAGNOSIS — F03A0 Unspecified dementia, mild, without behavioral disturbance, psychotic disturbance, mood disturbance, and anxiety: Secondary | ICD-10-CM | POA: Diagnosis not present

## 2022-07-09 DIAGNOSIS — B182 Chronic viral hepatitis C: Secondary | ICD-10-CM | POA: Diagnosis present

## 2022-07-09 DIAGNOSIS — I426 Alcoholic cardiomyopathy: Secondary | ICD-10-CM | POA: Diagnosis present

## 2022-07-09 DIAGNOSIS — R011 Cardiac murmur, unspecified: Secondary | ICD-10-CM | POA: Diagnosis not present

## 2022-07-09 DIAGNOSIS — R0602 Shortness of breath: Secondary | ICD-10-CM | POA: Diagnosis not present

## 2022-07-09 DIAGNOSIS — F1020 Alcohol dependence, uncomplicated: Secondary | ICD-10-CM

## 2022-07-09 DIAGNOSIS — J90 Pleural effusion, not elsewhere classified: Secondary | ICD-10-CM | POA: Diagnosis not present

## 2022-07-09 DIAGNOSIS — F1029 Alcohol dependence with unspecified alcohol-induced disorder: Secondary | ICD-10-CM | POA: Diagnosis not present

## 2022-07-09 DIAGNOSIS — Z515 Encounter for palliative care: Secondary | ICD-10-CM | POA: Diagnosis not present

## 2022-07-09 DIAGNOSIS — R262 Difficulty in walking, not elsewhere classified: Secondary | ICD-10-CM | POA: Diagnosis not present

## 2022-07-09 DIAGNOSIS — M6281 Muscle weakness (generalized): Secondary | ICD-10-CM | POA: Diagnosis not present

## 2022-07-09 DIAGNOSIS — J9811 Atelectasis: Secondary | ICD-10-CM | POA: Diagnosis not present

## 2022-07-09 HISTORY — DX: Liver disease, unspecified: K76.9

## 2022-07-09 HISTORY — DX: Unspecified cirrhosis of liver: K74.60

## 2022-07-09 LAB — CBC WITH DIFFERENTIAL/PLATELET
Abs Immature Granulocytes: 0.01 10*3/uL (ref 0.00–0.07)
Basophils Absolute: 0 10*3/uL (ref 0.0–0.1)
Basophils Relative: 0 %
Eosinophils Absolute: 0 10*3/uL (ref 0.0–0.5)
Eosinophils Relative: 0 %
HCT: 33.3 % — ABNORMAL LOW (ref 39.0–52.0)
Hemoglobin: 11.9 g/dL — ABNORMAL LOW (ref 13.0–17.0)
Immature Granulocytes: 1 %
Lymphocytes Relative: 20 %
Lymphs Abs: 0.4 10*3/uL — ABNORMAL LOW (ref 0.7–4.0)
MCH: 36.3 pg — ABNORMAL HIGH (ref 26.0–34.0)
MCHC: 35.7 g/dL (ref 30.0–36.0)
MCV: 101.5 fL — ABNORMAL HIGH (ref 80.0–100.0)
Monocytes Absolute: 0.1 10*3/uL (ref 0.1–1.0)
Monocytes Relative: 6 %
Neutro Abs: 1.6 10*3/uL — ABNORMAL LOW (ref 1.7–7.7)
Neutrophils Relative %: 73 %
Platelets: 93 10*3/uL — ABNORMAL LOW (ref 150–400)
RBC: 3.28 MIL/uL — ABNORMAL LOW (ref 4.22–5.81)
RDW: 15.3 % (ref 11.5–15.5)
WBC: 2.2 10*3/uL — ABNORMAL LOW (ref 4.0–10.5)
nRBC: 1.4 % — ABNORMAL HIGH (ref 0.0–0.2)

## 2022-07-09 LAB — URINALYSIS, ROUTINE W REFLEX MICROSCOPIC
Bilirubin Urine: NEGATIVE
Glucose, UA: NEGATIVE mg/dL
Hgb urine dipstick: NEGATIVE
Ketones, ur: NEGATIVE mg/dL
Nitrite: NEGATIVE
Protein, ur: NEGATIVE mg/dL
Specific Gravity, Urine: 1.024 (ref 1.005–1.030)
pH: 6 (ref 5.0–8.0)

## 2022-07-09 LAB — COMPREHENSIVE METABOLIC PANEL
ALT: 68 U/L — ABNORMAL HIGH (ref 0–44)
AST: 187 U/L — ABNORMAL HIGH (ref 15–41)
Albumin: 2.7 g/dL — ABNORMAL LOW (ref 3.5–5.0)
Alkaline Phosphatase: 45 U/L (ref 38–126)
Anion gap: 8 (ref 5–15)
BUN: 11 mg/dL (ref 8–23)
CO2: 22 mmol/L (ref 22–32)
Calcium: 7.1 mg/dL — ABNORMAL LOW (ref 8.9–10.3)
Chloride: 100 mmol/L (ref 98–111)
Creatinine, Ser: 0.88 mg/dL (ref 0.61–1.24)
GFR, Estimated: >60 mL/min (ref >60)
Glucose, Bld: 113 mg/dL — ABNORMAL HIGH (ref 70–99)
Potassium: 2.9 mmol/L — ABNORMAL LOW (ref 3.5–5.1)
Sodium: 130 mmol/L — ABNORMAL LOW (ref 135–145)
Total Bilirubin: 5.4 mg/dL — ABNORMAL HIGH (ref 0.3–1.2)
Total Protein: 8.3 g/dL — ABNORMAL HIGH (ref 6.5–8.1)

## 2022-07-09 LAB — PROTIME-INR
INR: 1.8 — ABNORMAL HIGH (ref 0.8–1.2)
Prothrombin Time: 20.8 seconds — ABNORMAL HIGH (ref 11.4–15.2)

## 2022-07-09 LAB — AMMONIA: Ammonia: 28 umol/L (ref 9–35)

## 2022-07-09 LAB — TROPONIN I (HIGH SENSITIVITY)
Troponin I (High Sensitivity): 42 ng/L — ABNORMAL HIGH (ref <18)
Troponin I (High Sensitivity): 42 ng/L — ABNORMAL HIGH (ref <18)

## 2022-07-09 LAB — PROCALCITONIN: Procalcitonin: <0.1 ng/mL

## 2022-07-09 MED ORDER — FUROSEMIDE 10 MG/ML IJ SOLN
20.0000 mg | Freq: Once | INTRAMUSCULAR | Status: AC
  Administered 2022-07-09: 20 mg via INTRAVENOUS
  Filled 2022-07-09: qty 2

## 2022-07-09 MED ORDER — MORPHINE SULFATE (PF) 2 MG/ML IV SOLN: 2.0000 mg | INTRAVENOUS | Status: DC | PRN

## 2022-07-09 MED ORDER — HEPARIN SODIUM (PORCINE) 5000 UNIT/ML IJ SOLN
5000.0000 [IU] | Freq: Three times a day (TID) | INTRAMUSCULAR | Status: DC
  Administered 2022-07-10: 5000 [IU] via SUBCUTANEOUS
  Filled 2022-07-09: qty 1

## 2022-07-09 MED ORDER — ADULT MULTIVITAMIN W/MINERALS CH
1.0000 | ORAL_TABLET | Freq: Every day | ORAL | Status: DC
  Administered 2022-07-10 – 2022-07-16 (×7): 1 via ORAL
  Filled 2022-07-09 (×7): qty 1

## 2022-07-09 MED ORDER — OXYCODONE HCL 5 MG PO TABS: 5.0000 mg | ORAL_TABLET | ORAL | Status: DC | PRN

## 2022-07-09 MED ORDER — LORAZEPAM 2 MG/ML IJ SOLN
1.0000 mg | INTRAMUSCULAR | Status: AC | PRN
  Administered 2022-07-11 (×4): 2 mg via INTRAVENOUS
  Administered 2022-07-11: 1 mg via INTRAVENOUS
  Administered 2022-07-11 – 2022-07-12 (×2): 2 mg via INTRAVENOUS
  Administered 2022-07-12 (×2): 4 mg via INTRAVENOUS
  Administered 2022-07-12: 2 mg via INTRAVENOUS
  Filled 2022-07-09 (×2): qty 1
  Filled 2022-07-09: qty 2
  Filled 2022-07-09: qty 1
  Filled 2022-07-09: qty 2
  Filled 2022-07-09: qty 1
  Filled 2022-07-09: qty 2
  Filled 2022-07-09: qty 1

## 2022-07-09 MED ORDER — THIAMINE HCL 100 MG/ML IJ SOLN
100.0000 mg | Freq: Every day | INTRAMUSCULAR | Status: DC
  Administered 2022-07-13 – 2022-07-15 (×2): 100 mg via INTRAVENOUS
  Filled 2022-07-09 (×2): qty 2

## 2022-07-09 MED ORDER — FOLIC ACID 1 MG PO TABS
1.0000 mg | ORAL_TABLET | Freq: Every day | ORAL | Status: DC
  Administered 2022-07-10 – 2022-07-16 (×7): 1 mg via ORAL
  Filled 2022-07-09 (×7): qty 1

## 2022-07-09 MED ORDER — ONDANSETRON HCL 4 MG PO TABS: 4.0000 mg | ORAL_TABLET | Freq: Four times a day (QID) | ORAL | Status: DC | PRN

## 2022-07-09 MED ORDER — SODIUM CHLORIDE 0.9 % IV SOLN
2.0000 g | Freq: Once | INTRAVENOUS | Status: AC
  Administered 2022-07-09: 2 g via INTRAVENOUS
  Filled 2022-07-09: qty 20

## 2022-07-09 MED ORDER — IOHEXOL 300 MG/ML  SOLN
75.0000 mL | Freq: Once | INTRAMUSCULAR | Status: AC | PRN
  Administered 2022-07-09: 75 mL via INTRAVENOUS

## 2022-07-09 MED ORDER — ONDANSETRON HCL 4 MG/2ML IJ SOLN: 4.0000 mg | Freq: Four times a day (QID) | INTRAMUSCULAR | Status: DC | PRN

## 2022-07-09 MED ORDER — LORAZEPAM 1 MG PO TABS
1.0000 mg | ORAL_TABLET | ORAL | Status: AC | PRN
  Administered 2022-07-10: 1 mg via ORAL
  Filled 2022-07-09: qty 1

## 2022-07-09 MED ORDER — POTASSIUM CHLORIDE CRYS ER 20 MEQ PO TBCR
40.0000 meq | EXTENDED_RELEASE_TABLET | Freq: Once | ORAL | Status: AC
  Administered 2022-07-09: 40 meq via ORAL
  Filled 2022-07-09: qty 2

## 2022-07-09 MED ORDER — LORAZEPAM 2 MG/ML IJ SOLN: 0.0000 mg | Freq: Two times a day (BID) | INTRAMUSCULAR | Status: DC

## 2022-07-09 MED ORDER — THIAMINE MONONITRATE 100 MG PO TABS
100.0000 mg | ORAL_TABLET | Freq: Every day | ORAL | Status: DC
  Administered 2022-07-10 – 2022-07-16 (×5): 100 mg via ORAL
  Filled 2022-07-09 (×6): qty 1

## 2022-07-09 MED ORDER — FUROSEMIDE 10 MG/ML IJ SOLN
40.0000 mg | Freq: Two times a day (BID) | INTRAMUSCULAR | Status: DC
  Administered 2022-07-10: 40 mg via INTRAVENOUS
  Filled 2022-07-09: qty 4

## 2022-07-09 MED ORDER — LORAZEPAM 2 MG/ML IJ SOLN
0.0000 mg | Freq: Four times a day (QID) | INTRAMUSCULAR | Status: DC
  Administered 2022-07-09 – 2022-07-10 (×2): 1 mg via INTRAVENOUS
  Administered 2022-07-10: 2 mg via INTRAVENOUS
  Filled 2022-07-09 (×6): qty 1

## 2022-07-09 NOTE — ED Triage Notes (Signed)
Patient seen at PCP yesterday for follow up after UC visit over the weekend.  PCP called today and said that patient needed to be seen because his magnesium was low and he has "fluid buildup everywhere."  Patient is jaundice.

## 2022-07-09 NOTE — ED Provider Notes (Signed)
[facility] EMERGENCY DEPARTMENT Provider Note  CSN: [id number] Arrival date & time: 07/09/22 1522  Chief Complaint(s) Jaundice  HPI Kyle Hammond is a 66 y.o. male with PMH alcoholic cirrhosis, hepatitis C, persistent alcohol use, tobacco use who presents emergency department for evaluation of jaundice and lower extremity edema.  Patient has had approximately a week of progressive worsening shortness of breath and lower extremity edema.  Patient was seen by his primary care physician yesterday who obtained lab work showing a significant increase in his total bilirubin from 1.8 to 6.7 and he was instructed to come to the emergency department for further evaluation.  On arrival, patient endorses swelling to bilateral lower extremities and difficulty with lying flat due to shortness of breath.  Last drink was this morning.  Denies chest pain, headache, fever, nausea, vomiting or other systemic symptoms.   Past Medical History Past Medical History:  Diagnosis Date   Cataract    Cirrhosis of liver ([place])    Hepatitis C    Liver lesion    Patient Active Problem List   Diagnosis Date Noted   Alcoholic cirrhosis of liver without ascites ([place]) 02/04/2022   History of colonic polyps 01/06/2022   Thrombocytopenia ([place]) 12/10/2021   Hyperlipidemia 12/10/2021   Tobacco use disorder, continuous 09/10/2021   Tobacco use 11/29/2020   Mild dementia ([place]) 11/29/2020   Left inguinal hernia 11/29/2020   At risk for falls 06/01/2016   Senile osteoporosis 06/01/2016   Vitamin D deficiency 06/01/2016   Current smoker 41/32/4401   Uncomplicated alcohol dependence ([place]) 05/19/2016   Chronic hepatitis C without hepatic coma ([place]) 05/19/2016   Anemia 05/19/2016   Low magnesium level 05/19/2016   Closed fracture of left femur ([place]) 05/07/2016   Home Medication(s) Prior to Admission medications   Not on File                                                                                                                                     Past Surgical History Past Surgical History:  Procedure Laterality Date   CATARACT EXTRACTION, BILATERAL  2019   HIP FRACTURE SURGERY Left 04/2016   Family History Family History  Problem Relation Age of Onset   Cancer Mother        bone   Heart disease Father    Heart attack Father    Colon cancer Neg Hx    Colon polyps Neg Hx     Social History Social History   Tobacco Use   Smoking status: Every Day    Packs/day: 1.00    Years: 50.00    Total pack years: 50.00    Types: Cigarettes   Smokeless tobacco: Never  Vaping Use   Vaping Use: Never used  Substance Use Topics   Alcohol use: Yes    Alcohol/week: 12.0 standard drinks of alcohol    Types: 12 Cans of beer per week  Drug use: No   Allergies Patient has no known allergies.  Review of Systems Review of Systems  Respiratory:  Positive for shortness of breath.   Cardiovascular:  Positive for leg swelling.  Skin:  Positive for color change.    Physical Exam Vital Signs  I have reviewed the triage vital signs BP 100/73   Pulse 98   Temp 97.8 F (36.6 C) (Oral)   Resp (!) 23   Ht '5\' 6"'$  (1.676 m)   Wt 59.9 kg   SpO2 92%   BMI 21.31 kg/m   Physical Exam Vitals and nursing note reviewed.  Constitutional:      General: He is not in acute distress.    Appearance: He is well-developed.  HENT:     Head: Normocephalic and atraumatic.  Eyes:     General: Scleral icterus present.     Conjunctiva/sclera: Conjunctivae normal.  Cardiovascular:     Rate and Rhythm: Normal rate and regular rhythm.     Heart sounds: No murmur heard. Pulmonary:     Effort: Pulmonary effort is normal. No respiratory distress.     Breath sounds: Wheezing and rales present.  Abdominal:     Palpations: Abdomen is soft.     Tenderness: There is no abdominal tenderness.  Musculoskeletal:        General: No swelling.     Cervical back: Neck supple.     Right lower leg: Edema present.      Left lower leg: Edema present.  Skin:    General: Skin is warm and dry.     Capillary Refill: Capillary refill takes less than 2 seconds.  Neurological:     Mental Status: He is alert.  Psychiatric:        Mood and Affect: Mood normal.     ED Results and Treatments Labs (all labs ordered are listed, but only abnormal results are displayed) Labs Reviewed  COMPREHENSIVE METABOLIC PANEL  CBC WITH DIFFERENTIAL/PLATELET  PROTIME-INR  AMMONIA  URINALYSIS, ROUTINE W REFLEX MICROSCOPIC  TROPONIN I (HIGH SENSITIVITY)                                                                                                                          Radiology DG Chest 2 View  Result Date: 07/09/2022 CLINICAL DATA:  Shortness of breath EXAM: CHEST - 2 VIEW COMPARISON:  Chest x-ray dated July 04, 2022 FINDINGS: Cardiomegaly. Mild interstitial opacities. Small right pleural effusion. No evidence of pneumothorax. New moderate compression deformity of the lower thoracic/upper lumbar spine. IMPRESSION: 1. Mild interstitial opacities, likely due to pulmonary edema. 2. Small right pleural effusion. 3. New moderate compression deformity of the lower thoracic/upper lumbar spine. Correlate for point tenderness. Electronically Signed   By: Yetta Glassman M.D.   On: 07/09/2022 17:01    Pertinent labs & imaging results that were available during my care of the patient were reviewed by me and considered in my medical decision making (see MDM for details).  Medications Ordered in ED Medications - No data to display                                                                                                                                   Procedures Procedures  (including critical care time)  Medical Decision Making / ED Course   This patient presents to the ED for concern of shortness of breath, lower extremity edema, this involves an extensive number of treatment options, and is a complaint that  carries with it a high risk of complications and morbidity.  The differential diagnosis includes decompensated cirrhosis, CHF exacerbation, hypoalbuminemia, renal failure, ACS, pneumonia  MDM: Patient seen in the emergency department for evaluation of multiple complaints as described above.  Physical exam with bilateral lower extremity edema and decreased breath sounds on the right with rales.  Laboratory evaluation with a leukopenia to 2.2, hemoglobin 11.9 with an MCV of 101.5 consistent with his daily alcohol use.  Platelets 93 consistent with bone marrow suppression from alcohol use.  Hyponatremia 130, potassium 2.9, calcium 7.1, AST 187, ALT 68, total bilirubin 5.4 with an INR of 1.8.  Chest x-ray with small right pleural effusion, pulmonary edema and a possible new thoracic vertebral compression fracture but the patient has no tenderness in this area.  Follow-up CT chest with contrast obtained to differentiate between pleural effusion versus pneumonia that shows a moderate-sized pleural effusion and no obvious pneumonia.  Diuresis begun and I spoke with gastroenterology who will assist the inpatient team with services recommendations.  I spent a significant amount of time counseling the patient on the need for alcohol cessation and CIWA orders were placed.  Patient then admitted.   Additional history obtained: -Additional history obtained from daughter, wife -External records from outside source obtained and reviewed including: Chart review including previous notes, labs, imaging, consultation notes   Lab Tests: -I ordered, reviewed, and interpreted labs.   The pertinent results include:   Labs Reviewed  COMPREHENSIVE METABOLIC PANEL  CBC WITH DIFFERENTIAL/PLATELET  PROTIME-INR  AMMONIA  URINALYSIS, ROUTINE W REFLEX MICROSCOPIC  TROPONIN I (HIGH SENSITIVITY)   Imaging Studies ordered: I ordered imaging studies including chest x-ray, CT chest with contrast I independently visualized and  interpreted imaging. I agree with the radiologist interpretation   Medicines ordered and prescription drug management: No orders of the defined types were placed in this encounter.   -I have reviewed the patients home medicines and have made adjustments as needed  Critical interventions none  Consultations Obtained: I requested consultation with the gastroenterologist on-call Dr. Jenetta Downer,  and discussed lab and imaging findings as well as pertinent plan - they recommend: Routine consultation and patient   Cardiac Monitoring: The patient was maintained on a cardiac monitor.  I personally viewed and interpreted the cardiac monitored which showed an underlying rhythm of: NSR, LBBB with PVCs  Social Determinants of Health:  Factors impacting patients care include: Daily  alcohol use, extensive time spent discussing alcohol cessation   Reevaluation: After the interventions noted above, I reevaluated the patient and found that they have :stayed the same  Co morbidities that complicate the patient evaluation  Past Medical History:  Diagnosis Date   Cataract    Cirrhosis of liver ([place])    Hepatitis C    Liver lesion       Dispostion: I considered admission for this patient, and due to fluid overload and worsening cirrhosis, patient require hospital admission     Final Clinical Impression(s) / ED Diagnoses Final diagnoses:  None     '@PCDICTATION'$ @    Teressa Lower, MD 07/10/22 1229

## 2022-07-10 ENCOUNTER — Inpatient Hospital Stay (HOSPITAL_COMMUNITY): Payer: Medicare Other

## 2022-07-10 ENCOUNTER — Other Ambulatory Visit (HOSPITAL_COMMUNITY): Payer: Self-pay | Admitting: *Deleted

## 2022-07-10 DIAGNOSIS — F1029 Alcohol dependence with unspecified alcohol-induced disorder: Secondary | ICD-10-CM

## 2022-07-10 DIAGNOSIS — F17209 Nicotine dependence, unspecified, with unspecified nicotine-induced disorders: Secondary | ICD-10-CM

## 2022-07-10 DIAGNOSIS — K7290 Hepatic failure, unspecified without coma: Secondary | ICD-10-CM

## 2022-07-10 DIAGNOSIS — I5021 Acute systolic (congestive) heart failure: Secondary | ICD-10-CM | POA: Diagnosis not present

## 2022-07-10 DIAGNOSIS — R011 Cardiac murmur, unspecified: Secondary | ICD-10-CM

## 2022-07-10 DIAGNOSIS — J90 Pleural effusion, not elsewhere classified: Secondary | ICD-10-CM

## 2022-07-10 DIAGNOSIS — B182 Chronic viral hepatitis C: Secondary | ICD-10-CM

## 2022-07-10 DIAGNOSIS — B1920 Unspecified viral hepatitis C without hepatic coma: Secondary | ICD-10-CM | POA: Diagnosis not present

## 2022-07-10 DIAGNOSIS — K7030 Alcoholic cirrhosis of liver without ascites: Secondary | ICD-10-CM

## 2022-07-10 LAB — COMPREHENSIVE METABOLIC PANEL
ALT: 61 U/L — ABNORMAL HIGH (ref 0–44)
AST: 168 U/L — ABNORMAL HIGH (ref 15–41)
Albumin: 2.4 g/dL — ABNORMAL LOW (ref 3.5–5.0)
Alkaline Phosphatase: 40 U/L (ref 38–126)
Anion gap: 9 (ref 5–15)
BUN: 11 mg/dL (ref 8–23)
CO2: 22 mmol/L (ref 22–32)
Calcium: 6.6 mg/dL — ABNORMAL LOW (ref 8.9–10.3)
Chloride: 102 mmol/L (ref 98–111)
Creatinine, Ser: 0.8 mg/dL (ref 0.61–1.24)
GFR, Estimated: >60 mL/min (ref >60)
Glucose, Bld: 82 mg/dL (ref 70–99)
Potassium: 3.1 mmol/L — ABNORMAL LOW (ref 3.5–5.1)
Sodium: 133 mmol/L — ABNORMAL LOW (ref 135–145)
Total Bilirubin: 4.6 mg/dL — ABNORMAL HIGH (ref 0.3–1.2)
Total Protein: 7.5 g/dL (ref 6.5–8.1)

## 2022-07-10 LAB — POTASSIUM
Potassium: 2.7 mmol/L — CL (ref 3.5–5.1)
Potassium: 4.5 mmol/L (ref 3.5–5.1)

## 2022-07-10 LAB — ECHOCARDIOGRAM COMPLETE
AR max vel: 2.08 cm2
AV Area VTI: 1.97 cm2
AV Area mean vel: 1.95 cm2
AV Mean grad: 2.8 mmHg
AV Peak grad: 5.6 mmHg
Ao pk vel: 1.18 m/s
Area-P 1/2: 6.07 cm2
Height: 66 in
MV M vel: 3.99 m/s
MV Peak grad: 63.7 mmHg
Radius: 0.4 cm
S' Lateral: 4.8 cm
Weight: 2032 oz

## 2022-07-10 LAB — CBC WITH DIFFERENTIAL/PLATELET
Abs Immature Granulocytes: 0.02 10*3/uL (ref 0.00–0.07)
Basophils Absolute: 0 10*3/uL (ref 0.0–0.1)
Basophils Relative: 0 %
Eosinophils Absolute: 0 10*3/uL (ref 0.0–0.5)
Eosinophils Relative: 0 %
HCT: 30.4 % — ABNORMAL LOW (ref 39.0–52.0)
Hemoglobin: 10.9 g/dL — ABNORMAL LOW (ref 13.0–17.0)
Immature Granulocytes: 1 %
Lymphocytes Relative: 36 %
Lymphs Abs: 1.2 10*3/uL (ref 0.7–4.0)
MCH: 35.7 pg — ABNORMAL HIGH (ref 26.0–34.0)
MCHC: 35.9 g/dL (ref 30.0–36.0)
MCV: 99.7 fL (ref 80.0–100.0)
Monocytes Absolute: 0.3 10*3/uL (ref 0.1–1.0)
Monocytes Relative: 7 %
Neutro Abs: 1.9 10*3/uL (ref 1.7–7.7)
Neutrophils Relative %: 56 %
Platelets: 87 10*3/uL — ABNORMAL LOW (ref 150–400)
RBC: 3.05 MIL/uL — ABNORMAL LOW (ref 4.22–5.81)
RDW: 15.7 % — ABNORMAL HIGH (ref 11.5–15.5)
WBC: 3.4 10*3/uL — ABNORMAL LOW (ref 4.0–10.5)
nRBC: 0.9 % — ABNORMAL HIGH (ref 0.0–0.2)

## 2022-07-10 LAB — MAGNESIUM
Magnesium: 0.6 mg/dL — CL (ref 1.7–2.4)
Magnesium: 1.6 mg/dL — ABNORMAL LOW (ref 1.7–2.4)
Magnesium: 2.1 mg/dL (ref 1.7–2.4)

## 2022-07-10 LAB — PROCALCITONIN: Procalcitonin: <0.1 ng/mL

## 2022-07-10 LAB — LACTIC ACID, PLASMA
Lactic Acid, Venous: 2.1 mmol/L (ref 0.5–1.9)
Lactic Acid, Venous: 2.4 mmol/L (ref 0.5–1.9)

## 2022-07-10 LAB — AMMONIA: Ammonia: 43 umol/L — ABNORMAL HIGH (ref 9–35)

## 2022-07-10 LAB — PROTIME-INR
INR: 1.8 — ABNORMAL HIGH (ref 0.8–1.2)
Prothrombin Time: 20.7 seconds — ABNORMAL HIGH (ref 11.4–15.2)

## 2022-07-10 MED ORDER — MAGNESIUM SULFATE 4 GM/100ML IV SOLN
4.0000 g | Freq: Once | INTRAVENOUS | Status: AC
  Administered 2022-07-10: 4 g via INTRAVENOUS
  Filled 2022-07-10: qty 100

## 2022-07-10 MED ORDER — POTASSIUM CHLORIDE CRYS ER 20 MEQ PO TBCR
40.0000 meq | EXTENDED_RELEASE_TABLET | ORAL | Status: AC
  Administered 2022-07-10 (×2): 40 meq via ORAL
  Filled 2022-07-10 (×2): qty 2

## 2022-07-10 MED ORDER — POTASSIUM CHLORIDE 10 MEQ/100ML IV SOLN
10.0000 meq | INTRAVENOUS | Status: AC
  Administered 2022-07-10 (×4): 10 meq via INTRAVENOUS
  Filled 2022-07-10: qty 100

## 2022-07-10 MED ORDER — PROSOURCE PLUS PO LIQD
30.0000 mL | Freq: Three times a day (TID) | ORAL | Status: DC
  Administered 2022-07-10 – 2022-07-16 (×13): 30 mL via ORAL
  Filled 2022-07-10 (×14): qty 30

## 2022-07-10 MED ORDER — POTASSIUM CHLORIDE CRYS ER 20 MEQ PO TBCR
40.0000 meq | EXTENDED_RELEASE_TABLET | ORAL | Status: AC
  Administered 2022-07-10: 40 meq via ORAL
  Filled 2022-07-10: qty 2

## 2022-07-10 MED ORDER — ENSURE ENLIVE PO LIQD
237.0000 mL | Freq: Two times a day (BID) | ORAL | Status: DC
  Administered 2022-07-10 – 2022-07-16 (×11): 237 mL via ORAL

## 2022-07-10 MED ORDER — CALCIUM GLUCONATE-NACL 1-0.675 GM/50ML-% IV SOLN
1.0000 g | Freq: Once | INTRAVENOUS | Status: AC
  Administered 2022-07-10: 1000 mg via INTRAVENOUS
  Filled 2022-07-10: qty 50

## 2022-07-10 MED ORDER — PANTOPRAZOLE SODIUM 40 MG IV SOLR
40.0000 mg | Freq: Two times a day (BID) | INTRAVENOUS | Status: DC
  Administered 2022-07-10 – 2022-07-16 (×13): 40 mg via INTRAVENOUS
  Filled 2022-07-10 (×13): qty 10

## 2022-07-10 NOTE — Progress Notes (Signed)
Pt and pts daughter informed this nurse that he did not want to be a DNR and would like full resuscitation attempts. MD notified and Full code order placed.

## 2022-07-10 NOTE — Progress Notes (Signed)
Date and time results received: 07/10/22   Test: potassium Critical Value: 2.7  Name of Provider Notified: MD Memon  Orders Received? Or Actions Taken?:  New orders

## 2022-07-10 NOTE — TOC Initial Note (Signed)
Transition of Care [facility]) - Initial/Assessment Note    Patient Details  Name: Kyle Hammond MRN: [id number] Date of Birth: 08-Mar-1956  Transition of [facility]) CM/SW Contact:    Boneta Lucks, RN Phone Number: 07/10/2022, 10:39 AM  Clinical Narrative:    Patient admitted with liver failure. TOC consulted for Substance abuse. Daughter at the bedside. Patient lives alone, he does not have access to alcohol everyday, but states if and when he has it he will drink a pint of liquor a day. He has been to [place] meeting years ago. He does not drive. Daughter states he will not go now. TOC offer to add resources to AVS in case he changes his mind.                Expected Discharge Plan: Home/Self Care Barriers to Discharge: Continued Medical Work up   Patient Goals and CMS Choice Patient states their goals for this hospitalization and ongoing recovery are:: to go home. CMS Medicare.gov Compare Post Acute Care list provided to:: Patient Represenative (must comment) Choice offered to / list presented to : Adult Children  Expected Discharge Plan and Services Expected Discharge Plan: Home/Self Care       Living arrangements for the past 2 months: Single Family Home                     Prior Living Arrangements/Services Living arrangements for the past 2 months: Single Family Home Lives with:: Self Patient language and need for interpreter reviewed:: Yes        Need for Family Participation in Patient Care: Yes (Comment) Care giver support system in place?: Yes (comment)   Criminal Activity/Legal Involvement Pertinent to Current Situation/Hospitalization: No - Comment as needed  Activities of Daily Living Home Assistive Devices/Equipment: None ADL Screening (condition at time of admission) Patient's cognitive ability adequate to safely complete daily activities?: Yes Is the patient deaf or have difficulty hearing?: Yes Does the patient have difficulty seeing, even when wearing  glasses/contacts?: No Does the patient have difficulty concentrating, remembering, or making decisions?: Yes Patient able to express need for assistance with ADLs?: Yes Does the patient have difficulty dressing or bathing?: No Independently performs ADLs?: Yes (appropriate for developmental age) Does the patient have difficulty walking or climbing stairs?: Yes Weakness of Legs: Both Weakness of Arms/Hands: Both  Permission Sought/Granted      Share Information with NAME: [name]     Permission granted to share info w Relationship: Daughter     Emotional Assessment         Alcohol / Substance Use: Alcohol Use Psych Involvement: No (comment)  Admission diagnosis:  Liver failure ([place]) [K72.90] Patient Active Problem List   Diagnosis Date Noted   Pleural effusion on right 07/10/2022   Liver failure ([place]) 66/44/0347   Alcoholic cirrhosis of liver without ascites ([place]) 02/04/2022   History of colonic polyps 01/06/2022   Thrombocytopenia ([place]) 12/10/2021   Hyperlipidemia 12/10/2021   Tobacco use disorder, continuous 09/10/2021   Tobacco use 11/29/2020   Mild dementia ([place]) 11/29/2020   Left inguinal hernia 11/29/2020   At risk for falls 06/01/2016   Senile osteoporosis 06/01/2016   Vitamin D deficiency 06/01/2016   Current smoker 06/01/2016   Alcohol dependence ([place]) 05/19/2016   Chronic hepatitis C without hepatic coma ([place]) 05/19/2016   Anemia 05/19/2016   Low magnesium level 05/19/2016   Closed fracture of left femur ([place]) 05/07/2016   PCP:  Gwenlyn Perking, FNP Pharmacy:  [facility] [address], [place] - [place] [address] [place] [address] Phone: [phone] Fax: [phone]

## 2022-07-10 NOTE — Progress Notes (Signed)
Patient admitted to the hospital earlier this morning by Dr. Clearence Ped  Patient seen and examined.  Appears to be calm at this time.  Denies any shortness of breath.  Has been receiving Ativan for tremors.  Family notes that overall swelling is better after receiving Lasix.  Decompensated liver cirrhosis -History of ongoing alcohol use as well as hepatitis C -Discriminant function for alcoholic hepatitis is elevated, but after discussing with Dr. Gala Romney, he would not be a candidate for prednisolone due to untreated hep C -Overall liver enzymes do seem to be trending down  Anasarca/right pleural effusion -Suspect this is secondary to decompensated liver cirrhosis -Family notes that overall swelling is better after receiving Lasix -Hold off on further Lasix for now due to borderline blood pressures as well as persistent hypokalemia  Chronic hepatitis C -Has followed up with GI in the past, but treatment was not pursued since he had ongoing alcohol use  Alcohol dependence -Concern for withdrawal -Daughter reports that he does have a history of significant withdrawal and has had 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  She reports this was suggested by patient's primary care physician -I think patient would be a reasonable candidate for hospice at this time -We will request palliative care consult to discuss further with  family

## 2022-07-10 NOTE — Assessment & Plan Note (Signed)
-  With peripheral edema, dyspnea, orthopnea - Multifactorial with hepatitis C and alcoholic cirrhosis both contributing - AST elevated 187, ALT elevated at 68, T. bili elevated at 5.4 - These numbers are all downtrending when compared to October 25 - CTA shows cirrhotic liver morphology - Patient has no intentions of quitting alcohol - Trend CMP, INR, ammonia in the a.m. - Continue diuresis - Monitor intake and output - Continue to trend lactic acid anticipating a slow clearing given liver disease - Continue to monitor

## 2022-07-10 NOTE — Assessment & Plan Note (Signed)
-   Moderate right pleural effusion - Anticipate improvement with continued diuresis - Monitor clinical picture - If dyspnea becomes worse consider reimaging and/or thoracentesis

## 2022-07-10 NOTE — Assessment & Plan Note (Signed)
-   With known cirrhosis - Not pursuing treatment due to continued alcohol use - AST improved to 187 down from 226 - ALT improved to 68 from 71 - Total bili improved from 6.7-5.4 - Avoid hepatotoxic agents when possible - Trend CMP, ammonia and INR in the a.m.

## 2022-07-10 NOTE — Assessment & Plan Note (Signed)
-   Continue CIWA protocol - Continue to replace thiamine and folic acid - Patient has no intentions including - Continue to monitor

## 2022-07-10 NOTE — Care Management Important Message (Signed)
Important Message  Patient Details  Name: Kyle Hammond MRN: [id number] Date of Birth: 1955/12/09   Medicare Important Message Given:  Yes     Tommy Medal 07/10/2022, 9:54 AM

## 2022-07-10 NOTE — Assessment & Plan Note (Signed)
-   Has not been smoking since he has been dyspneic - Encouraged to continue smoking cessation - Continue to monitor

## 2022-07-10 NOTE — Progress Notes (Signed)
*  PRELIMINARY RESULTS* Echocardiogram 2D Echocardiogram has been performed.  Kyle Hammond 07/10/2022, 3:05 PM

## 2022-07-10 NOTE — Progress Notes (Signed)
Pt alert and oriented x4. Pt has moderate tremors and has been slightly agitated. 1 mg ativan given per CIWA protocol. Pt reports 0/10 pain. Pt is yellow mews. Pt slept through the night.

## 2022-07-10 NOTE — Progress Notes (Signed)
Initial Nutrition Assessment  DOCUMENTATION CODES:   Not applicable  INTERVENTION:   -Continue MVI with minerals daily -Ensure Enlive po BID, each supplement provides 350 kcal and 20 grams of protein -30 ml Prosource Plus TID, each supplement provides 100 kcals and 15 grams protein -Liberalized diet to 2 gram sodium with 1.5 L fluid restriction for wider variety of meal selections  NUTRITION DIAGNOSIS:   Increased nutrient needs related to chronic illness (cirrhosis) as evidenced by estimated needs.  GOAL:   Patient will meet greater than or equal to 90% of their needs  MONITOR:   PO intake, Supplement acceptance  REASON FOR ASSESSMENT:   Malnutrition Screening Tool    ASSESSMENT:   Pt with medical history significant of cirrhosis of liver, hepatitis C, alcohol use disorder, tobacco use disorder, presents with chief complaint of dyspnea.  Pt admitted with liver failure and rt pleural effusion.   Reviewed I/O's: -360 ml x 24 hours  UOP: 600 ml x 24 hours  Pt unavailable at time of visit. Attempted to speak with pt via call to hospital room phone, however, unable to reach. RD unable to obtain further nutrition-related history or complete nutrition-focused physical exam at this time.     Per hospitalist notes, plan for re-imaging or thoracentesis if dyspnea becomes worse.   Pt currently on a heart health diet with 1.5 L fluid restriction. Noted meal completions 0%. Daughter reports pt does not always have access to alcohol, but when he does he drinks about a pint of liquor per day.   Reviewed wt hx; wt has been stable over the past 6 months. Noted pt with lower extremity edema, which may be masking true weight loss as well as fat and muscle depletions. Pt is at high risk for malnutrition given co-morbidities and alcohol abuse, however, unable to identify at this time.   Medications reviewed and include folic acid, lasix, ativan, and thiamine.   Labs reviewed: Na: 133,  K: 3.1 (inpatient orders for glycemic control are none).    Diet Order:   Diet Order             Diet Heart Room service appropriate? Yes; Fluid consistency: Thin; Fluid restriction: 1500 mL Fluid  Diet effective now                   EDUCATION NEEDS:   No education needs have been identified at this time  Skin:  Skin Assessment: Reviewed RN Assessment  Last BM:  07/07/22  Height:   Ht Readings from Last 1 Encounters:  07/09/22 '5\' 6"'$  (1.676 m)    Weight:   Wt Readings from Last 1 Encounters:  07/10/22 57.6 kg    Ideal Body Weight:  64.5 kg  BMI:  Body mass index is 20.5 kg/m.  Estimated Nutritional Needs:   Kcal:  1800-2000  Protein:  90-105 grams  Fluid:  > 1.8 L    Loistine Chance, RD, LDN, [place] Registered Dietitian II Certified Diabetes Care and Education Specialist Please refer to [facility] for RD and/or RD on-call/weekend/after hours pager

## 2022-07-10 NOTE — Consult Note (Signed)
Cardiology Consultation   Patient ID: Kyle Hammond MRN: [id number]; DOB: Nov 03, 1955  Admit date: 07/09/2022 Date of Consult: 07/10/2022  PCP:  Kyle Hammond, [place] [facility] Cardiologist: New     Patient Profile:   Kyle Hammond is a 66 y.o. male with a hx of ETOH abuse with cirrhosis and Hep C who is being seen 07/10/2022 for the evaluation of dyspnea at the request of Dr Roderic Palau.  History of Present Illness:   Mr. Weissberg 66 yo male history of EtOH cirrnosis with hep C has turned down therapy and has refused to stop drinking presents with dyspnea and jaundice. Some reported orthopnea at home, LE edema.    Na 130 K 2.9 BUN 11 Cr 0.88 AST 187 ALT 68 Tbili 5.4 WBC 2.2 Hgb 11.9 Plt 93 INR 1.8 Procal <0.10 Lactic acid 2.4  Trop 42-->42 CXR mild opacities likely pulm edema CT chest: mod right pleural eeffusion EKG SR, LBBB with PVCs 06/2022 echo: LVEF 20-25%  Past Medical History:  Diagnosis Date   Cataract    Cirrhosis of liver (HCC)    Hepatitis C    Liver lesion     Past Surgical History:  Procedure Laterality Date   CATARACT EXTRACTION, BILATERAL  2019   HIP FRACTURE SURGERY Left 04/2016      Inpatient Medications: Scheduled Meds:  (feeding supplement) PROSource Plus  30 mL Oral TID BM   feeding supplement  237 mL Oral BID BM   folic acid  1 mg Oral Daily   LORazepam  0-4 mg Intravenous Q6H   Followed by   Derrill Memo ON 07/11/2022] LORazepam  0-4 mg Intravenous Q12H   multivitamin with minerals  1 tablet Oral Daily   pantoprazole (PROTONIX) IV  40 mg Intravenous Q12H   potassium chloride  40 mEq Oral Q4H   thiamine  100 mg Oral Daily   Or   thiamine  100 mg Intravenous Daily   Continuous Infusions:  magnesium sulfate bolus IVPB     potassium chloride 10 mEq (07/10/22 1451)   PRN Meds: LORazepam **OR** LORazepam, morphine injection, ondansetron **OR** ondansetron (ZOFRAN) IV, oxyCODONE  Allergies:   No Known Allergies  Social  History:   Social History   Socioeconomic History   Marital status: Divorced    Spouse name: Not on file   Number of children: 2   Years of education: 12   Highest education level: High school graduate  Occupational History   Occupation: disabled  Tobacco Use   Smoking status: Every Day    Packs/day: 1.00    Years: 50.00    Total pack years: 50.00    Types: Cigarettes   Smokeless tobacco: Never  Vaping Use   Vaping Use: Never used  Substance and Sexual Activity   Alcohol use: Yes    Alcohol/week: 12.0 standard drinks of alcohol    Types: 12 Cans of beer per week   Drug use: No   Sexual activity: Not Currently  Other Topics Concern   Not on file  Social History Narrative   Not on file   Social Determinants of Health   Financial Resource Strain: Not on file  Food Insecurity: No Food Insecurity (07/10/2022)   Hunger Vital Sign    Worried About Running Out of Food in the Last Year: Never true    Ran Out of Food in the Last Year: Never true  Transportation Needs: No Transportation Needs (07/10/2022)   PRAPARE - Transportation  Lack of Transportation (Medical): No    Lack of Transportation (Non-Medical): No  Physical Activity: Not on file  Stress: Not on file  Social Connections: Not on file  Intimate Partner Violence: Not At Risk (07/10/2022)   Humiliation, Afraid, Rape, and Kick questionnaire    Fear of Current or Ex-Partner: No    Emotionally Abused: No    Physically Abused: No    Sexually Abused: No    Family History:    Family History  Problem Relation Age of Onset   Cancer Mother        bone   Heart disease Father    Heart attack Father    Colon cancer Neg Hx    Colon polyps Neg Hx      ROS:  Please see the history of present illness.   All other ROS reviewed and negative.     Physical Exam/Data:   Vitals:   07/10/22 0547 07/10/22 0748 07/10/22 0953 07/10/22 1143  BP: (!) 88/66 110/79 103/76 102/70  Pulse: (!) 103  (!) 102 (!) 104  Resp: 16      Temp: 98 F (36.7 C)  98.2 F (36.8 C)   TempSrc:   Oral   SpO2: 97%  94% 95%  Weight: 57.6 kg     Height:        Intake/Output Summary (Last 24 hours) at 07/10/2022 1527 Last data filed at 07/10/2022 1020 Gross per 24 hour  Intake 240 ml  Output 1400 ml  Net -1160 ml      07/10/2022    5:47 AM 07/09/2022    3:43 PM 07/08/2022    1:49 PM  Last 3 Weights  Weight (lbs) 127 lb 132 lb 0.9 oz 132 lb 2 oz  Weight (kg) 57.607 kg 59.9 kg 59.932 kg     Body mass index is 20.5 kg/m.  General:  Well nourished, well developed, in no acute distress HEENT: normal Neck: no JVD Vascular: No carotid bruits; Distal pulses 2+ bilaterally Cardiac:  normal S1, S2; RRR; no murmur  Lungs:  mild crackles bilaterally Abd: soft, nontender, no hepatomegaly  Ext: no edema Musculoskeletal:  No deformities, BUE and BLE strength normal and equal Skin: warm and dry  Neuro:  CNs 2-12 intact, no focal abnormalities noted Psych:  Normal affect     Laboratory Data:  High Sensitivity Troponin:   Recent Labs  Lab 07/09/22 1710 07/09/22 1910  TROPONINIHS 42* 42*     Chemistry Recent Labs  Lab 07/08/22 1435 07/09/22 1710 07/10/22 0257 07/10/22 1312  NA 132* 130* 133*  --   K 3.6 2.9* 3.1* 2.7*  CL 96 100 102  --   CO2 19* 22 22  --   GLUCOSE 80 113* 82  --   BUN '8 11 11  '$ --   CREATININE 0.96 0.88 0.80  --   CALCIUM 7.5* 7.1* 6.6*  --   MG 0.7*  --  0.6* 1.6*  GFRNONAA  --  >60 >60  --   ANIONGAP  --  8 9  --     Recent Labs  Lab 07/08/22 1435 07/09/22 1710 07/10/22 0257  PROT 8.1 8.3* 7.5  ALBUMIN 3.2* 2.7* 2.4*  AST 226* 187* 168*  ALT 71* 68* 61*  ALKPHOS 60 45 40  BILITOT 6.7* 5.4* 4.6*   Lipids No results for input(s): "CHOL", "TRIG", "HDL", "LABVLDL", "LDLCALC", "CHOLHDL" in the last 168 hours.  Hematology Recent Labs  Lab 07/08/22 1435 07/09/22 1710 07/10/22 0257  WBC 3.1* 2.2* 3.4*  RBC 3.38* 3.28* 3.05*  HGB 11.9* 11.9* 10.9*  HCT 34.3* 33.3* 30.4*   MCV 102* 101.5* 99.7  MCH 35.2* 36.3* 35.7*  MCHC 34.7 35.7 35.9  RDW 12.9 15.3 15.7*  PLT 90* 93* 87*   Thyroid No results for input(s): "TSH", "FREET4" in the last 168 hours.  BNPNo results for input(s): "BNP", "PROBNP" in the last 168 hours.  DDimer No results for input(s): "DDIMER" in the last 168 hours.   Radiology/Studies:  ECHOCARDIOGRAM COMPLETE  Result Date: 07/10/2022    ECHOCARDIOGRAM REPORT   Patient Name:   MIQUEL STACKS Brecheen Date of Exam: 07/10/2022 Medical Rec #:  [id number]     Height:       66.0 in Accession #:    [id number]    Weight:       127.0 lb Date of Birth:  04-25-1956    BSA:          1.649 m Patient Age:    85 years      BP:           102/70 mmHg Patient Gender: M             HR:           104 bpm. Exam Location:  [facility] Procedure: 2D Echo, Cardiac Doppler and Color Doppler Indications:    Murmur R01.1  History:        Patient has no prior history of Echocardiogram examinations.                 Risk Factors:Current Smoker and Dyslipidemia. Alcohol dependence                 ([place]), Liver failure ([place]), Chronic hepatitis C without hepatic                 coma ([place]), Alcoholic cirrhosis of liver without ascites ([place]).  Sonographer:    Alvino Chapel RCS Referring Phys: [id number] ASIA B Mathis  1. Left ventricular ejection fraction, by estimation, is 20 to 25%. The left ventricle has severely decreased function. The left ventricle demonstrates global hypokinesis. Left ventricular diastolic parameters are indeterminate.  2. Right ventricular systolic function is low normal. The right ventricular size is mildly enlarged. There is mildly elevated pulmonary artery systolic pressure.  3. Right atrial size was severely dilated.  4. The mitral valve is abnormal. Mild to moderate mitral valve regurgitation. No evidence of mitral stenosis.  5. The tricuspid valve is abnormal. Tricuspid valve regurgitation is moderate.  6. The aortic valve is tricuspid. Aortic valve  regurgitation is not visualized. No aortic stenosis is present.  7. The inferior vena cava is dilated in size with <50% respiratory variability, suggesting right atrial pressure of 15 mmHg. FINDINGS  Left Ventricle: Left ventricular ejection fraction, by estimation, is 20 to 25%. The left ventricle has severely decreased function. The left ventricle demonstrates global hypokinesis. The left ventricular internal cavity size was normal in size. There is no left ventricular hypertrophy. Left ventricular diastolic parameters are indeterminate. Right Ventricle: The right ventricular size is mildly enlarged. Right vetricular wall thickness was not well visualized. Right ventricular systolic function is low normal. There is mildly elevated pulmonary artery systolic pressure. The tricuspid regurgitant velocity is 2.69 m/s, and with an assumed right atrial pressure of 15 mmHg, the estimated right ventricular systolic pressure is 53.6 mmHg. Left Atrium: Left atrial size was normal in size. Right Atrium: Right atrial size was severely  dilated. Pericardium: There is no evidence of pericardial effusion. Mitral Valve: The mitral valve is abnormal. There is mild thickening of the mitral valve leaflet(s). There is mild calcification of the mitral valve leaflet(s). Mild mitral annular calcification. Mild to moderate mitral valve regurgitation. No evidence of mitral valve stenosis. Tricuspid Valve: The tricuspid valve is abnormal. Tricuspid valve regurgitation is moderate . No evidence of tricuspid stenosis. Aortic Valve: The aortic valve is tricuspid. Aortic valve regurgitation is not visualized. No aortic stenosis is present. Aortic valve mean gradient measures 2.8 mmHg. Aortic valve peak gradient measures 5.6 mmHg. Aortic valve area, by VTI measures 1.97 cm. Pulmonic Valve: The pulmonic valve was not well visualized. Pulmonic valve regurgitation is not visualized. No evidence of pulmonic stenosis. Aorta: The aortic root is normal  in size and structure. Venous: The inferior vena cava is dilated in size with less than 50% respiratory variability, suggesting right atrial pressure of 15 mmHg. IAS/Shunts: No atrial level shunt detected by color flow Doppler.  LEFT VENTRICLE PLAX 2D LVIDd:         5.50 cm LVIDs:         4.80 cm LV PW:         1.10 cm LV IVS:        1.00 cm LVOT diam:     2.00 cm LV SV:         36 LV SV Index:   22 LVOT Area:     3.14 cm  RIGHT VENTRICLE TAPSE (M-mode): 1.8 cm LEFT ATRIUM             Index        RIGHT ATRIUM           Index LA diam:        4.30 cm 2.61 cm/m   RA Area:     22.60 cm LA Vol (A2C):   86.3 ml 52.34 ml/m  RA Volume:   80.90 ml  49.07 ml/m LA Vol (A4C):   98.7 ml 59.86 ml/m LA Biplane Vol: 92.9 ml 56.34 ml/m  AORTIC VALVE AV Area (Vmax):    2.08 cm AV Area (Vmean):   1.95 cm AV Area (VTI):     1.97 cm AV Vmax:           117.82 cm/s AV Vmean:          78.304 cm/s AV VTI:            0.182 m AV Peak Grad:      5.6 mmHg AV Mean Grad:      2.8 mmHg LVOT Vmax:         77.83 cm/s LVOT Vmean:        48.567 cm/s LVOT VTI:          0.114 m LVOT/AV VTI ratio: 0.63  AORTA Ao Root diam: 3.00 cm MITRAL VALVE                  TRICUSPID VALVE MV Area (PHT): 6.07 cm       TR Peak grad:   28.9 mmHg MV Decel Time: 125 msec       TR Vmax:        269.00 cm/s MR Peak grad:    63.7 mmHg MR Mean grad:    34.0 mmHg    SHUNTS MR Vmax:         399.00 cm/s  Systemic VTI:  0.11 m MR Vmean:        265.0 cm/s  Systemic Diam: 2.00 cm MR PISA:         1.01 cm MR PISA Eff ROA: 6 mm MR PISA Radius:  0.40 cm MV E velocity: 114.75 cm/s MV A velocity: 64.00 cm/s MV E/A ratio:  1.79 Carlyle Dolly MD Electronically signed by Carlyle Dolly MD Signature Date/Time: 07/10/2022/3:24:32 PM    Final    CT Chest W Contrast  Result Date: 07/09/2022 CLINICAL DATA:  Abnormal chest radiograph EXAM: CT CHEST WITH CONTRAST TECHNIQUE: Multidetector CT imaging of the chest was performed during intravenous contrast administration.  RADIATION DOSE REDUCTION: This exam was performed according to the departmental dose-optimization program which includes automated exposure control, adjustment of the mA and/or kV according to patient size and/or use of iterative reconstruction technique. CONTRAST:  57m OMNIPAQUE IOHEXOL 300 MG/ML  SOLN COMPARISON:  Chest radiograph dated 07/09/2022, 07/04/2022, MR abdomen dated 02/27/2022 FINDINGS: Cardiovascular: Left atrial enlargement. Trace pericardial effusion. Great vessels are normal in course and caliber. No central pulmonary emboli. Coronary artery, aortic valvular, and mitral annular calcifications. Aortic atherosclerosis. Mediastinum/Nodes: Partially imaged thyroid gland without nodules meeting criteria for imaging follow-up by size. Normal esophagus. Precarinal lymph node measures 10 mm (2:62). Lungs/Pleura: The central airways are patent. There are layering secretions within the trachea. Mild bronchial wall thickening. Right middle lobe calcified granuloma. Right lower lobe relaxation atelectasis. No pneumothorax. Moderate right pleural effusion. Upper abdomen: Subcentimeter hepatic hypodensities, better evaluated on prior MR abdomen. Nodular hepatic contour. Ovoid calcification posterior to the right hepatic lobe may reflect sequela of prior inflammation. Punctate calcifications in the spleen, likely sequela of prior granulomatous infection. 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 Medullary nephrocalcinosis. Musculoskeletal: No acute or abnormal lytic or blastic osseous lesions. Anterior wedging of L1, similar to 07/04/2022. old sternal fracture deformity. IMPRESSION: 1. Moderate right pleural effusion with relaxation atelectasis of the right lower lobe. 2. Cirrhotic liver morphology. Subcentimeter hepatic hypodensities are better evaluated on prior MRI. 3. Medullary nephrocalcinosis. 4. Precarinal lymph node measuring 10 mm, likely reactive. 5. Aortic  and coronary artery calcifications. Aortic Atherosclerosis (ICD10-I70.0). Electronically Signed   By: LDarrin NipperM.D.   On: 07/09/2022 19:39   DG Chest 2 View  Result Date: 07/09/2022 CLINICAL DATA:  Shortness of breath EXAM: CHEST - 2 VIEW COMPARISON:  Chest x-ray dated July 04, 2022 FINDINGS: Cardiomegaly. Mild interstitial opacities. Small right pleural effusion. No evidence of pneumothorax. New moderate compression deformity of the lower thoracic/upper lumbar spine. IMPRESSION: 1. Mild interstitial opacities, likely due to pulmonary edema. 2. Small right pleural effusion. 3. New moderate compression deformity of the lower thoracic/upper lumbar spine. Correlate for point tenderness. Electronically Signed   By: LYetta GlassmanM.D.   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  -soft bp's, SBPs 80s to low 100s. I think he will be very limited on medical therapy due to bp's - follow bp's, could consider trial low dose toprol 12.'5mg'$  daily particuarly given his runs of SVT and NSVT. Would hold on ace/arb/arni/aldactone. Could consider SLGT2i if hospice is not chosen.  - with severe electrolyte abnormalities  hold on futher diuretic today, likely resume tomorrow   2. EtoH cirrhosis/Hep C - he has refused any further treatment, wants to contniue to drink EtOH - presented jaundiced, elevated bili and LFTs. Low platelets, elevated INR - prior MRI cirrhosis, multiple nodules unclear etiology intermediate suspiction for hepatocellular CA  - with severe liver disease and now severe heart failure overall very  poor prognosis, particularly in patient who has not wanted to pursue medical therapies. Would recommend palliative consultation  3.NSVT/SVT with aberrancy - noted both short runs of NSVT and SVT with his underlying LBBB - marked electrolyte abnormalities likely contributing, if ongoing despite replacement could try low dose toprol 12.'5mg'$  daily pending bp's.    Overall poor prognosis severe advanced liver disease and heart failure. Patient has not wanted further management of liver diseae and has wanted to continue drinking. Agree with palliative care evaluation and potentially hospice. I think limited HF management options as listed above.      For questions or updates, please contact [place] Please consult [email or web address] for contact info under    Signed, Carlyle Dolly, MD  07/10/2022 3:27 PM

## 2022-07-10 NOTE — H&P (Signed)
History and Physical    Patient: [name]:[id number] DOB: 30-Nov-1955 DOA: 07/09/2022 DOS: the patient was seen and examined on 07/10/2022 PCP: Kyle Perking, FNP  Patient coming from: Home  Chief Complaint:  Chief Complaint  Patient presents with   Jaundice   HPI: Kyle Hammond is a 66 y.o. male with medical history significant of cirrhosis of liver, hepatitis C, alcohol use disorder, tobacco use disorder, presents to ED with chief complaint of dyspnea.  Patient went to urgent care about 1 week ago for cough.  At that point the cough had been already going on for several days.  He was diagnosed with pneumonia and prescribed doxycycline.  Daughter is unsure if patient was taking doxycycline, but thinks that he likely had not started it.  He followed up with the PCP yesterday who did labs and then told him to come into the ED due to his liver numbers.  Patient does have a history of hep C and and alcoholic cirrhosis.  He has not pursued treatment for alcoholic cirrhosis because he continues to drink.  Patient's daughter reports he will drink as much as he can afford to have that today.  His drink of choice is whiskey, but sometimes he can only afford beer.  She is not able to quantify how much is average for him.  She reports he has not been eating for an unknown amount of time.  She lives about 45 minutes from him and had not observed his eating habits until she saw him recently.  She reports he complains of nausea and has dry heaving regularly.  He has not been vomiting.  She reports that his cough is wet sounding, but nonproductive.  Patient reports he has had palpitations and chest pain.  They are worse when he is experiencing symptoms of orthopnea.  Getting up and walking outside helps his symptoms.  Daughter reports that she saw on her camera that he was outside every 30 minutes throughout the night trying to get a breath of fresh air.  He had peripheral edema and she is unsure how  long its been going on.  His left leg is swollen more than his right.  He has a lot of pain in his left leg entirely, not just at the cath.  Daughter has nothing else to add to history at this time.  Patient reports he has no other complaints.  He is very hard of hearing and prefers daughter provide his history.  He came into the ED was found that his liver enzymes were high but improving from the day prior.  Since patient has been noncompliant with his doxycycline he was started on Rocephin, however his procalcitonin is undetectable so antibiotics would not be continued at this time.  Patient has no other complaints at this time.  Patient is a current smoker.  Current drinker.  Does not use illicit drugs.  He is vaccinated for COVID.  Patient is DNR. Review of Systems: As mentioned in the history of present illness. All other systems reviewed and are negative. Past Medical History:  Diagnosis Date   Cataract    Cirrhosis of liver ([place])    Hepatitis C    Liver lesion    Past Surgical History:  Procedure Laterality Date   CATARACT EXTRACTION, BILATERAL  2019   HIP FRACTURE SURGERY Left 04/2016   Social History:  reports that he has been smoking cigarettes. He has a 50.00 pack-year smoking history. He has never used  smokeless tobacco. He reports current alcohol use of about 12.0 standard drinks of alcohol per week. He reports that he does not use drugs.  No Known Allergies  Family History  Problem Relation Age of Onset   Cancer Mother        bone   Heart disease Father    Heart attack Father    Colon cancer Neg Hx    Colon polyps Neg Hx     Prior to Admission medications   Medication Sig Start Date End Date Taking? Authorizing Provider  albuterol (VENTOLIN HFA) 108 (90 Base) MCG/ACT inhaler Inhale 2 puffs into the lungs every 6 (six) hours as needed for wheezing or shortness of breath.   Yes [provider]  DOXYCYCLINE HYCLATE PO Take 100 mg by mouth in the morning and at  bedtime. For 10 day supply. Start date 07/08/22   Yes [provider]  furosemide (LASIX) 20 MG tablet Take 20 mg by mouth daily. For 3 days supply. Start date 07/08/22   Yes [provider]  predniSONE (DELTASONE) 10 MG tablet Take 20 mg by mouth daily with breakfast. For 5 days Start on 07/08/22   Yes [provider]    Physical Exam: Vitals:   07/09/22 2030 07/09/22 2100 07/09/22 2230 07/09/22 2322  BP: 102/81 100/83 103/77 102/80  Pulse: (!) 102 (!) 101 99 (!) 101  Resp: (!) '21 17 18 19  '$ Temp:    98.5 F (36.9 C)  TempSrc:      SpO2: 96% 94% 95% 98%  Weight:      Height:       1.  General: Patient lying supine in bed,  no acute distress   2. Psychiatric: Alert and oriented x 3, mood and behavior normal for situation, pleasant and cooperative with exam   3. Neurologic: Speech and language are normal, face is symmetric, moves all 4 extremities voluntarily, at baseline without acute deficits on limited exam   4. HEENMT:  Head is atraumatic, normocephalic, pupils reactive to light, neck is supple, trachea is midline, mucous membranes are moist   5. Respiratory : Lungs are diminished in the bilateral lower lung fields, but otherwise clear to auscultation bilaterally without wheezing, rhonchi, rales, no cyanosis, no increase in work of breathing or accessory muscle use   6. Cardiovascular : Heart rate normal, rhythm is regular, murmur present, rubs or gallops, peripheral edema present, peripheral pulses palpated   7. Gastrointestinal:  Abdomen is soft, minimally distended, denies tenderness to palpation but grimaces with palpation of right upper quadrant bowel sounds active, no masses or organomegaly palpated   8. Skin:  Skin is warm, dry and intact without rashes, acute lesions, or ulcers on limited exam   9.Musculoskeletal:  No acute deformities or trauma,  peripheral edema present, peripheral pulses palpated, no tenderness to palpation in the  extremities  Data Reviewed: In the ED Temp 97.8, heart rate 96-105, respiratory 18-23, blood pressure 94/73-100/79 - Lipid yet 2.2 likely related to liver disease - Hypokalemic at 2.9 likely related to poor p.o. intake Albumin 2.7 likely related to liver disease AST 187, ALT 68 likely related to liver disease Troponin flat at 42, 62 T. bili elevated likely related to liver disease Patient was given potassium in the ED Admission requested for liver failure with fluid overload  Assessment and Plan: * Liver failure ([place]) -With peripheral edema, dyspnea, orthopnea - Multifactorial with hepatitis C and alcoholic cirrhosis both contributing - AST elevated 187, ALT elevated at  82, T. bili elevated at 5.4 - These numbers are all downtrending when compared to October 25 - CTA shows cirrhotic liver morphology - Patient has no intentions of quitting alcohol - Trend CMP, INR, ammonia in the a.m. - Continue diuresis - Monitor intake and output - Continue to trend lactic acid anticipating a slow clearing given liver disease - Continue to monitor  Pleural effusion on right - Moderate right pleural effusion - Anticipate improvement with continued diuresis - Monitor clinical picture - If dyspnea becomes worse consider reimaging and/or thoracentesis  Tobacco use disorder, continuous - Has not been smoking since he has been dyspneic - Encouraged to continue smoking cessation - Continue to monitor  Chronic hepatitis C without hepatic coma ([place]) - With known cirrhosis - Not pursuing treatment due to continued alcohol use - AST improved to 187 down from 226 - ALT improved to 68 from 71 - Total bili improved from 6.7-5.4 - Avoid hepatotoxic agents when possible - Trend CMP, ammonia and INR in the a.m.  Alcohol dependence ([place])  - Continue CIWA protocol - Continue to replace thiamine and folic acid - Patient has no intentions including - Continue to monitor      Advance Care  Planning:   Code Status: Full Code  Consults: GI will see in the a.m.  Family Communication: Daughter at bedside  Severity of Illness: The appropriate patient status for this patient is INPATIENT. Inpatient status is judged to be reasonable and necessary in order to provide the required intensity of service to ensure the patient's safety. The patient's presenting symptoms, physical exam findings, and initial radiographic and laboratory data in the context of their chronic comorbidities is felt to place them at high risk for further clinical deterioration. Furthermore, it is not anticipated that the patient will be medically stable for discharge from the hospital within 2 midnights of admission.   * I certify that at the point of admission it is my clinical judgment that the patient will require inpatient hospital care spanning beyond 2 midnights from the point of admission due to high intensity of service, high risk for further deterioration and high frequency of surveillance required.*  Author: Rolla Plate, DO 07/10/2022 1:53 AM  For on call review [email or web address].

## 2022-07-11 DIAGNOSIS — F1029 Alcohol dependence with unspecified alcohol-induced disorder: Secondary | ICD-10-CM | POA: Diagnosis not present

## 2022-07-11 DIAGNOSIS — K7031 Alcoholic cirrhosis of liver with ascites: Secondary | ICD-10-CM

## 2022-07-11 DIAGNOSIS — B182 Chronic viral hepatitis C: Secondary | ICD-10-CM | POA: Diagnosis not present

## 2022-07-11 DIAGNOSIS — K7290 Hepatic failure, unspecified without coma: Secondary | ICD-10-CM | POA: Diagnosis not present

## 2022-07-11 LAB — CBC
HCT: 34.8 % — ABNORMAL LOW (ref 39.0–52.0)
Hemoglobin: 12.3 g/dL — ABNORMAL LOW (ref 13.0–17.0)
MCH: 36.5 pg — ABNORMAL HIGH (ref 26.0–34.0)
MCHC: 35.3 g/dL (ref 30.0–36.0)
MCV: 103.3 fL — ABNORMAL HIGH (ref 80.0–100.0)
Platelets: 81 10*3/uL — ABNORMAL LOW (ref 150–400)
RBC: 3.37 MIL/uL — ABNORMAL LOW (ref 4.22–5.81)
RDW: 16.4 % — ABNORMAL HIGH (ref 11.5–15.5)
WBC: 3.2 10*3/uL — ABNORMAL LOW (ref 4.0–10.5)
nRBC: 0.9 % — ABNORMAL HIGH (ref 0.0–0.2)

## 2022-07-11 LAB — COMPREHENSIVE METABOLIC PANEL
ALT: 62 U/L — ABNORMAL HIGH (ref 0–44)
AST: 164 U/L — ABNORMAL HIGH (ref 15–41)
Albumin: 2.6 g/dL — ABNORMAL LOW (ref 3.5–5.0)
Alkaline Phosphatase: 42 U/L (ref 38–126)
Anion gap: 10 (ref 5–15)
BUN: 19 mg/dL (ref 8–23)
CO2: 21 mmol/L — ABNORMAL LOW (ref 22–32)
Calcium: 7.5 mg/dL — ABNORMAL LOW (ref 8.9–10.3)
Chloride: 100 mmol/L (ref 98–111)
Creatinine, Ser: 0.89 mg/dL (ref 0.61–1.24)
GFR, Estimated: >60 mL/min (ref >60)
Glucose, Bld: 90 mg/dL (ref 70–99)
Potassium: 4.3 mmol/L (ref 3.5–5.1)
Sodium: 131 mmol/L — ABNORMAL LOW (ref 135–145)
Total Bilirubin: 5.6 mg/dL — ABNORMAL HIGH (ref 0.3–1.2)
Total Protein: 8 g/dL (ref 6.5–8.1)

## 2022-07-11 LAB — PROCALCITONIN: Procalcitonin: <0.1 ng/mL

## 2022-07-11 LAB — PHOSPHORUS: Phosphorus: 2.1 mg/dL — ABNORMAL LOW (ref 2.5–4.6)

## 2022-07-11 LAB — BRAIN NATRIURETIC PEPTIDE: B Natriuretic Peptide: 2119 pg/mL — ABNORMAL HIGH (ref 0.0–100.0)

## 2022-07-11 LAB — MAGNESIUM: Magnesium: 1.7 mg/dL (ref 1.7–2.4)

## 2022-07-11 MED ORDER — ALBUTEROL SULFATE (2.5 MG/3ML) 0.083% IN NEBU
INHALATION_SOLUTION | RESPIRATORY_TRACT | Status: AC
  Administered 2022-07-11: 2.5 mg
  Filled 2022-07-11: qty 3

## 2022-07-11 MED ORDER — IPRATROPIUM-ALBUTEROL 0.5-2.5 (3) MG/3ML IN SOLN
3.0000 mL | RESPIRATORY_TRACT | Status: DC | PRN
  Administered 2022-07-11 – 2022-07-12 (×2): 3 mL via RESPIRATORY_TRACT
  Filled 2022-07-11 (×2): qty 3

## 2022-07-11 MED ORDER — CHLORHEXIDINE GLUCONATE CLOTH 2 % EX PADS
6.0000 | MEDICATED_PAD | Freq: Every day | CUTANEOUS | Status: DC
  Administered 2022-07-11 – 2022-07-16 (×6): 6 via TOPICAL

## 2022-07-11 MED ORDER — LORAZEPAM 2 MG/ML IJ SOLN
0.0000 mg | INTRAMUSCULAR | Status: AC
  Administered 2022-07-11 – 2022-07-12 (×3): 2 mg via INTRAVENOUS
  Administered 2022-07-12: 4 mg via INTRAVENOUS
  Administered 2022-07-12 (×2): 2 mg via INTRAVENOUS
  Administered 2022-07-12: 3 mg via INTRAVENOUS
  Administered 2022-07-12 – 2022-07-13 (×2): 4 mg via INTRAVENOUS
  Filled 2022-07-11: qty 1
  Filled 2022-07-11: qty 2
  Filled 2022-07-11 (×2): qty 1
  Filled 2022-07-11: qty 2
  Filled 2022-07-11: qty 1
  Filled 2022-07-11: qty 2
  Filled 2022-07-11 (×2): qty 1

## 2022-07-11 MED ORDER — SPIRITUS FRUMENTI
1.0000 | Freq: Three times a day (TID) | ORAL | Status: DC
  Administered 2022-07-12: 1 via ORAL
  Filled 2022-07-11 (×5): qty 1

## 2022-07-11 MED ORDER — NICOTINE 21 MG/24HR TD PT24
21.0000 mg | MEDICATED_PATCH | Freq: Every day | TRANSDERMAL | Status: DC
  Administered 2022-07-11 – 2022-07-16 (×6): 21 mg via TRANSDERMAL
  Filled 2022-07-11 (×6): qty 1

## 2022-07-11 MED ORDER — LORAZEPAM 2 MG/ML IJ SOLN
0.0000 mg | Freq: Two times a day (BID) | INTRAMUSCULAR | Status: AC
  Administered 2022-07-14: 1 mg via INTRAVENOUS
  Filled 2022-07-11 (×2): qty 1

## 2022-07-11 MED ORDER — MAGNESIUM SULFATE 4 GM/100ML IV SOLN
4.0000 g | Freq: Once | INTRAVENOUS | Status: AC
  Administered 2022-07-11: 4 g via INTRAVENOUS
  Filled 2022-07-11: qty 100

## 2022-07-11 MED ORDER — IPRATROPIUM BROMIDE 0.02 % IN SOLN
RESPIRATORY_TRACT | Status: AC
  Administered 2022-07-11: 0.5 mg
  Filled 2022-07-11: qty 2.5

## 2022-07-11 NOTE — Progress Notes (Signed)
PROGRESS NOTE    Kyle Hammond  XTK:[id number] DOB: 1955/12/15 DOA: 07/09/2022 PCP: Gwenlyn Perking, FNP    Brief Narrative:  66 year old male with a long history of alcohol abuse, hepatitis C, cirrhosis, tobacco use, admitted to the hospital with volume overload and right pleural effusion.  He was started on intravenous diuretics.  Echocardiogram performed showed EF of 20 to 25%.  Seen by cardiology who felt he was not a good candidate for aggressive measures and recommended palliative care conversations.  Hospital course complicated by development of worsening alcohol withdrawal.   Assessment & Plan:   Principal Problem:   Liver failure (HCC) Active Problems:   Alcohol dependence ([place])   Chronic hepatitis C without hepatic coma (HCC)   Tobacco use disorder, continuous   Pleural effusion on right   Decompensated liver cirrhosis -History of ongoing alcohol use as well as hepatitis C -Discriminant function for alcoholic hepatitis is elevated, but after discussing with Dr. Gala Romney, he would not be a candidate for prednisolone due to untreated hep C   Anasarca/right pleural effusion -Suspect this is secondary to decompensated liver cirrhosis -Family notes that overall swelling is better after receiving Lasix -Hold off on further Lasix for now due to borderline blood pressures    Chronic hepatitis C -Has followed up with GI in the past, but treatment was not pursued since he had ongoing alcohol use   Acute alcohol withdrawal with delirium tremens -History of alcohol withdrawal seizures -Patient having worsening withdrawal despite large doses of Ativan.  He has received approximately 9 mg of Ativan during this shift. -He is repeatedly trying to crawl out of bed -He will need closer monitoring and will be moved to stepdown unit in case he needs escalated therapy -Precedex may be challenging since his blood pressure is already in the 90s. -I had a long conversation with the  patient's daughter regarding further management of his withdrawal.  Patient has made it very clear that he does not have any intentions to stop drinking after he leaves the hospital and will continue to drink for the rest of his life.  Although not routinely used, considering patient's current condition, worsening withdrawal, poor prognosis with comorbidities including cardiomyopathy and cirrhosis, repeated statements that he will resume drinking when able, and daughter's intentions to pursue a more palliative approach in his care, I offered to treat the patient with oral ethyl alcohol in addition to benzodiazepines in order to prevent worsening of his withdrawal.  Patient's daughter is in agreement with this approach. 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  Discussed patient's wishes regarding resuscitation at end-of-life including CPR and intubation/mechanical ventilation.  Patient made it clear that he would not wish to receive heroic measures including intubation or CPR.  DNR placed on chart. -We will need to discuss further goals of care including transitioning to palliative/hospice approach.  Further family will likely be able to discuss this on Monday.  This will also allow palliative care service to be involved in these conversations.   DVT prophylaxis: SCDs Start: 07/09/22 2339  Code Status: DNR Family Communication: Discussed with patient's  daughter who is his main decision maker Disposition Plan: Status is:  Inpatient Remains inpatient appropriate because: Continued management of alcohol withdrawal     Consultants:  Cardiology  Procedures:  Echo  Antimicrobials:      Subjective: Patient has been increasingly agitated and tremulous.  Has been receiving large doses of Ativan.  Required waist belt restraint since he has been trying to climb out of bed.  Daughter says that he has been seeing things that are not there.  Objective: Vitals:   07/11/22 0631 07/11/22 0704 07/11/22 1410 07/11/22 1717  BP: 95/75  98/70 93/79  Pulse: 100  (!) 102 (!) 101  Resp: 20  (!) 24 (!) 24  Temp: 98.2 F (36.8 C)  98.9 F (37.2 C) 97.8 F (36.6 C)  TempSrc: Oral  Oral Oral  SpO2: 94% 96% 96% 99%  Weight: 57 kg     Height:        Intake/Output Summary (Last 24 hours) at 07/11/2022 2007 Last data filed at 07/11/2022 1717 Gross per 24 hour  Intake 400 ml  Output 950 ml  Net -550 ml   Filed Weights   07/09/22 1543 07/10/22 0547 07/11/22 0631  Weight: 59.9 kg 57.6 kg 57 kg    Examination:  General exam: Laying in bed, he is tremulous Respiratory system: Clear to auscultation. Respiratory effort normal. Cardiovascular system: S1 & S2 heard, tachycardic. No JVD, murmurs, rubs, gallops or clicks. No pedal edema. Gastrointestinal system: Abdomen is nondistended, soft and nontender. No organomegaly or masses felt. Normal bowel sounds heard. Central nervous system: Confused at times, no focal deficits Extremities: Symmetric 5 x 5 power. Skin: No rashes, lesions or ulcers Psychiatry: Oriented x3, poor insight into his health    Data Reviewed: I have personally reviewed following labs and imaging studies  CBC: Recent Labs  Lab 07/08/22 1435 07/09/22 1710 07/10/22 0257 07/11/22 0521  WBC 3.1* 2.2* 3.4* 3.2*  NEUTROABS 1.5 1.6* 1.9  --   HGB 11.9* 11.9* 10.9* 12.3*  HCT 34.3* 33.3* 30.4* 34.8*  MCV 102* 101.5* 99.7 103.3*  PLT 90* 93* 87* 81*   Basic Metabolic Panel: Recent Labs   Lab 07/08/22 1435 07/09/22 1710 07/10/22 0257 07/10/22 1312 07/10/22 2111 07/11/22 0521  NA 132* 130* 133*  --   --  131*  K 3.6 2.9* 3.1* 2.7* 4.5 4.3  CL 96 100 102  --   --  100  CO2 19* 22 22  --   --  21*  GLUCOSE 80 113* 82  --   --  90  BUN '8 11 11  '$ --   --  19  CREATININE 0.96 0.88 0.80  --   --  0.89  CALCIUM 7.5* 7.1* 6.6*  --   --  7.5*  MG 0.7*  --  0.6* 1.6* 2.1 1.7  PHOS  --   --   --   --   --  2.1*   GFR: Estimated Creatinine Clearance: 66.7 mL/min (by C-G formula based on SCr of 0.89 mg/dL). Liver Function Tests: Recent Labs  Lab 07/08/22 1435 07/09/22 1710 07/10/22 0257 07/11/22 0521  AST 226* 187* 168* 164*  ALT 71* 68* 61* 62*  ALKPHOS 60 45 40 42  BILITOT 6.7* 5.4* 4.6* 5.6*  PROT 8.1 8.3* 7.5 8.0  ALBUMIN 3.2* 2.7* 2.4* 2.6*   No results for input(s): "LIPASE", "AMYLASE" in the last 168 hours. Recent Labs  Lab 07/09/22 1710 07/10/22 0257  AMMONIA 28 43*   Coagulation  Profile: Recent Labs  Lab 07/09/22 1710 07/10/22 0257  INR 1.8* 1.8*   Cardiac Enzymes: No results for input(s): "CKTOTAL", "CKMB", "CKMBINDEX", "TROPONINI" in the last 168 hours. BNP (last 3 results) No results for input(s): "PROBNP" in the last 8760 hours. HbA1C: No results for input(s): "HGBA1C" in the last 72 hours. CBG: No results for input(s): "GLUCAP" in the last 168 hours. Lipid Profile: No results for input(s): "CHOL", "HDL", "LDLCALC", "TRIG", "CHOLHDL", "LDLDIRECT" in the last 72 hours. Thyroid Function Tests: No results for input(s): "TSH", "T4TOTAL", "FREET4", "T3FREE", "THYROIDAB" in the last 72 hours. Anemia Panel: No results for input(s): "VITAMINB12", "FOLATE", "FERRITIN", "TIBC", "IRON", "RETICCTPCT" in the last 72 hours. Sepsis Labs: Recent Labs  Lab 07/09/22 1910 07/09/22 2352 07/10/22 0257 07/11/22 0521  PROCALCITON <0.10  --  <0.10 <0.10  LATICACIDVEN  --  2.4* 2.1*  --     No results found for this or any previous visit (from the past  240 hour(s)).       Radiology Studies: ECHOCARDIOGRAM COMPLETE  Result Date: 07/10/2022    ECHOCARDIOGRAM REPORT   Patient Name:   MONTERIO BOB Wirsing Date of Exam: 07/10/2022 Medical Rec #:  [id number]     Height:       66.0 in Accession #:    [id number]    Weight:       127.0 lb Date of Birth:  [birth year]    BSA:          1.649 m Patient Age:    66 years      BP:           102/70 mmHg Patient Gender: M             HR:           104 bpm. Exam Location:  [facility] Procedure: 2D Echo, Cardiac Doppler and Color Doppler Indications:    Murmur R01.1  History:        Patient has no prior history of Echocardiogram examinations.                 Risk Factors:Current Smoker and Dyslipidemia. Alcohol dependence                 ([place]), Liver failure ([place]), Chronic hepatitis C without hepatic                 coma ([place]), Alcoholic cirrhosis of liver without ascites ([place]).  Sonographer:    Alvino Chapel RCS Referring Phys: [id number] ASIA B [place]  1. Left ventricular ejection fraction, by estimation, is 20 to 25%. The left ventricle has severely decreased function. The left ventricle demonstrates global hypokinesis. Left ventricular diastolic parameters are indeterminate.  2. Right ventricular systolic function is low normal. The right ventricular size is mildly enlarged. There is mildly elevated pulmonary artery systolic pressure.  3. Right atrial size was severely dilated.  4. The mitral valve is abnormal. Mild to moderate mitral valve regurgitation. No evidence of mitral stenosis.  5. The tricuspid valve is abnormal. Tricuspid valve regurgitation is moderate.  6. The aortic valve is tricuspid. Aortic valve regurgitation is not visualized. No aortic stenosis is present.  7. The inferior vena cava is dilated in size with <50% respiratory variability, suggesting right atrial pressure of 15 mmHg. FINDINGS  Left Ventricle: Left ventricular ejection fraction, by estimation, is 20 to 25%. The left ventricle has  severely decreased function. The left ventricle demonstrates global hypokinesis. The left ventricular internal cavity size was normal  in size. There is no left ventricular hypertrophy. Left ventricular diastolic parameters are indeterminate. Right Ventricle: The right ventricular size is mildly enlarged. Right vetricular wall thickness was not well visualized. Right ventricular systolic function is low normal. There is mildly elevated pulmonary artery systolic pressure. The tricuspid regurgitant velocity is 2.69 m/s, and with an assumed right atrial pressure of 15 mmHg, the estimated right ventricular systolic pressure is 32.3 mmHg. Left Atrium: Left atrial size was normal in size. Right Atrium: Right atrial size was severely dilated. Pericardium: There is no evidence of pericardial effusion. Mitral Valve: The mitral valve is abnormal. There is mild thickening of the mitral valve leaflet(s). There is mild calcification of the mitral valve leaflet(s). Mild mitral annular calcification. Mild to moderate mitral valve regurgitation. No evidence of mitral valve stenosis. Tricuspid Valve: The tricuspid valve is abnormal. Tricuspid valve regurgitation is moderate . No evidence of tricuspid stenosis. Aortic Valve: The aortic valve is tricuspid. Aortic valve regurgitation is not visualized. No aortic stenosis is present. Aortic valve mean gradient measures 2.8 mmHg. Aortic valve peak gradient measures 5.6 mmHg. Aortic valve area, by VTI measures 1.97 cm. Pulmonic Valve: The pulmonic valve was not well visualized. Pulmonic valve regurgitation is not visualized. No evidence of pulmonic stenosis. Aorta: The aortic root is normal in size and structure. Venous: The inferior vena cava is dilated in size with less than 50% respiratory variability, suggesting right atrial pressure of 15 mmHg. IAS/Shunts: No atrial level shunt detected by color flow Doppler.  LEFT VENTRICLE PLAX 2D LVIDd:         5.50 cm LVIDs:         4.80 cm LV  PW:         1.10 cm LV IVS:        1.00 cm LVOT diam:     2.00 cm LV SV:         36 LV SV Index:   22 LVOT Area:     3.14 cm  RIGHT VENTRICLE TAPSE (M-mode): 1.8 cm LEFT ATRIUM             Index        RIGHT ATRIUM           Index LA diam:        4.30 cm 2.61 cm/m   RA Area:     22.60 cm LA Vol (A2C):   86.3 ml 52.34 ml/m  RA Volume:   80.90 ml  49.07 ml/m LA Vol (A4C):   98.7 ml 59.86 ml/m LA Biplane Vol: 92.9 ml 56.34 ml/m  AORTIC VALVE AV Area (Vmax):    2.08 cm AV Area (Vmean):   1.95 cm AV Area (VTI):     1.97 cm AV Vmax:           117.82 cm/s AV Vmean:          78.304 cm/s AV VTI:            0.182 m AV Peak Grad:      5.6 mmHg AV Mean Grad:      2.8 mmHg LVOT Vmax:         77.83 cm/s LVOT Vmean:        48.567 cm/s LVOT VTI:          0.114 m LVOT/AV VTI ratio: 0.63  AORTA Ao Root diam: 3.00 cm MITRAL VALVE                  TRICUSPID VALVE MV  Area (PHT): 6.07 cm       TR Peak grad:   28.9 mmHg MV Decel Time: 125 msec       TR Vmax:        269.00 cm/s MR Peak grad:    63.7 mmHg MR Mean grad:    34.0 mmHg    SHUNTS MR Vmax:         399.00 cm/s  Systemic VTI:  0.11 m MR Vmean:        265.0 cm/s   Systemic Diam: 2.00 cm MR PISA:         1.01 cm MR PISA Eff ROA: 6 mm MR PISA Radius:  0.40 cm MV E velocity: 114.75 cm/s MV A velocity: 64.00 cm/s MV E/A ratio:  1.79 Carlyle Dolly MD Electronically signed by Carlyle Dolly MD Signature Date/Time: 07/10/2022/3:24:32 PM    Final         Scheduled Meds:  (feeding supplement) PROSource Plus  30 mL Oral TID BM   feeding supplement  237 mL Oral BID BM   folic acid  1 mg Oral Daily   LORazepam  0-4 mg Intravenous Q4H   Followed by   Derrill Memo ON 07/13/2022] LORazepam  0-4 mg Intravenous Q12H   multivitamin with minerals  1 tablet Oral Daily   nicotine  21 mg Transdermal Daily   pantoprazole (PROTONIX) IV  40 mg Intravenous Q12H   spiritus frumenti  1 each Oral TID   thiamine  100 mg Oral Daily   Or   thiamine  100 mg Intravenous Daily    Continuous Infusions:  magnesium sulfate bolus IVPB       LOS: 2 days    Time spent: 40 mins    Kathie Dike, MD [facility]   If 7PM-7AM, please contact night-coverage [email or web address]  07/11/2022, 8:07 PM

## 2022-07-11 NOTE — Progress Notes (Addendum)
Patient continues to be agitated-pulling at devices and trying to get out of bed. I notified Dr. Ulice Bold that patient was increasingly agitated and he prescribed Ativan more frequently. Applied [employer] to patient because he continued to try to get out of bed. Daughter notified that we applied posey. Patient's MEWS score turned to a yellow.

## 2022-07-11 NOTE — Progress Notes (Addendum)
Pt has become progressively agitated throughout shift requiring more  PRN ativan due to increased CIWA scores. Pt has been restless throughout this shift and has made multiple attempts to get out of the bed. Pt has been able to be redirected by nursing staff. Bed alarm is on and call light is within reach.

## 2022-07-11 NOTE — Progress Notes (Signed)
   07/11/22 1410  Assess: MEWS Score  Temp 98.9 F (37.2 C)  BP 98/70  MAP (mmHg) 79  Pulse Rate (!) 102  Resp (!) 24  SpO2 96 %  O2 Device Room Air  Assess: MEWS Score  MEWS Temp 0  MEWS Systolic 1  MEWS Pulse 1  MEWS RR 1  MEWS LOC 0  MEWS Score 3  MEWS Score Color Yellow  Assess: if the MEWS score is Yellow or Red  Were vital signs taken at a resting state? Yes  Focused Assessment No change from prior assessment  Does the patient meet 2 or more of the SIRS criteria? No  MEWS guidelines implemented *See Row Information* Yes  Treat  MEWS Interventions Administered prn meds/treatments;Administered scheduled meds/treatments  Pain Scale 0-10  Pain Score 0  Take Vital Signs  Increase Vital Sign Frequency  Yellow: Q 2hr X 2 then Q 4hr X 2, if remains yellow, continue Q 4hrs  Escalate  MEWS: Escalate Yellow: discuss with charge nurse/RN and consider discussing with provider and RRT  Notify: Charge Nurse/RN  Name of Charge Nurse/RN Notified Izora Gala RN  Date Charge Nurse/RN Notified 07/11/22  Time Charge Nurse/RN Notified 60  Notify: Provider  Provider Name/Title Dr. Roderic Palau  Date Provider Notified 07/11/22  Assess: SIRS CRITERIA  SIRS Temperature  0  SIRS Pulse 1  SIRS Respirations  1  SIRS WBC 1  SIRS Score Sum  3

## 2022-07-11 NOTE — Plan of Care (Signed)
  Problem: Education: Goal: Knowledge of General Education information will improve Description: Including pain rating scale, medication(s)/side effects and non-pharmacologic comfort measures 07/11/2022 2123 by Santa Lighter, RN Outcome: Progressing 07/11/2022 1217 by Santa Lighter, RN Outcome: Progressing   Problem: Health Behavior/Discharge Planning: Goal: Ability to manage health-related needs will improve 07/11/2022 2123 by Santa Lighter, RN Outcome: Progressing 07/11/2022 1217 by Santa Lighter, RN Outcome: Progressing

## 2022-07-11 NOTE — Progress Notes (Signed)
Pt has made several attempts to get out of the bed. PT has woken up disoriented at times but is easy to redirect. Pt has been given Ativan based on CIWA score throughout the night. Pt is currently asleep and no distress is noted.

## 2022-07-11 NOTE — Progress Notes (Signed)
Patient with ETOH, abuse , Presents with rhonchi , smoker -- possible aspiration??  On room air

## 2022-07-11 NOTE — Progress Notes (Signed)
Patient was transferred to [facility]. I assumed care of patient in ICU. Patient resting comfortably. Call bell within reach. Patient's daughter in room.

## 2022-07-11 NOTE — Plan of Care (Signed)
  Problem: Education: Goal: Knowledge of General Education information will improve Description Including pain rating scale, medication(s)/side effects and non-pharmacologic comfort measures Outcome: Progressing   Problem: Health Behavior/Discharge Planning: Goal: Ability to manage health-related needs will improve Outcome: Progressing   

## 2022-07-12 DIAGNOSIS — K7031 Alcoholic cirrhosis of liver with ascites: Secondary | ICD-10-CM | POA: Diagnosis not present

## 2022-07-12 DIAGNOSIS — B182 Chronic viral hepatitis C: Secondary | ICD-10-CM | POA: Diagnosis not present

## 2022-07-12 DIAGNOSIS — F1029 Alcohol dependence with unspecified alcohol-induced disorder: Secondary | ICD-10-CM | POA: Diagnosis not present

## 2022-07-12 DIAGNOSIS — K7290 Hepatic failure, unspecified without coma: Secondary | ICD-10-CM | POA: Diagnosis not present

## 2022-07-12 LAB — RENAL FUNCTION PANEL
Albumin: 2.6 g/dL — ABNORMAL LOW (ref 3.5–5.0)
Anion gap: 8 (ref 5–15)
BUN: 14 mg/dL (ref 8–23)
CO2: 24 mmol/L (ref 22–32)
Calcium: 7.6 mg/dL — ABNORMAL LOW (ref 8.9–10.3)
Chloride: 97 mmol/L — ABNORMAL LOW (ref 98–111)
Creatinine, Ser: 0.75 mg/dL (ref 0.61–1.24)
GFR, Estimated: >60 mL/min (ref >60)
Glucose, Bld: 122 mg/dL — ABNORMAL HIGH (ref 70–99)
Phosphorus: 2.3 mg/dL — ABNORMAL LOW (ref 2.5–4.6)
Potassium: 3.6 mmol/L (ref 3.5–5.1)
Sodium: 129 mmol/L — ABNORMAL LOW (ref 135–145)

## 2022-07-12 LAB — CBC
HCT: 36.3 % — ABNORMAL LOW (ref 39.0–52.0)
Hemoglobin: 12.4 g/dL — ABNORMAL LOW (ref 13.0–17.0)
MCH: 35.8 pg — ABNORMAL HIGH (ref 26.0–34.0)
MCHC: 34.2 g/dL (ref 30.0–36.0)
MCV: 104.9 fL — ABNORMAL HIGH (ref 80.0–100.0)
Platelets: 72 10*3/uL — ABNORMAL LOW (ref 150–400)
RBC: 3.46 MIL/uL — ABNORMAL LOW (ref 4.22–5.81)
RDW: 16.8 % — ABNORMAL HIGH (ref 11.5–15.5)
WBC: 2.8 10*3/uL — ABNORMAL LOW (ref 4.0–10.5)
nRBC: 0.7 % — ABNORMAL HIGH (ref 0.0–0.2)

## 2022-07-12 LAB — MAGNESIUM: Magnesium: 1.6 mg/dL — ABNORMAL LOW (ref 1.7–2.4)

## 2022-07-12 LAB — MRSA NEXT GEN BY PCR, NASAL: MRSA by PCR Next Gen: NOT DETECTED

## 2022-07-12 MED ORDER — IPRATROPIUM-ALBUTEROL 0.5-2.5 (3) MG/3ML IN SOLN
3.0000 mL | Freq: Two times a day (BID) | RESPIRATORY_TRACT | Status: DC
  Administered 2022-07-12 – 2022-07-16 (×8): 3 mL via RESPIRATORY_TRACT
  Filled 2022-07-12 (×8): qty 3

## 2022-07-12 MED ORDER — SPIRITUS FRUMENTI
1.0000 | Freq: Two times a day (BID) | ORAL | Status: DC
  Administered 2022-07-12 – 2022-07-14 (×4): 1 via ORAL
  Filled 2022-07-12 (×6): qty 1

## 2022-07-12 MED ORDER — MAGNESIUM SULFATE 4 GM/100ML IV SOLN
4.0000 g | Freq: Once | INTRAVENOUS | Status: AC
  Administered 2022-07-12: 4 g via INTRAVENOUS
  Filled 2022-07-12: qty 100

## 2022-07-12 MED ORDER — POTASSIUM CHLORIDE IN NACL 40-0.9 MEQ/L-% IV SOLN: INTRAVENOUS | Status: AC

## 2022-07-12 MED ORDER — POTASSIUM CHLORIDE CRYS ER 20 MEQ PO TBCR
40.0000 meq | EXTENDED_RELEASE_TABLET | ORAL | Status: AC
  Administered 2022-07-12 (×2): 40 meq via ORAL
  Filled 2022-07-12 (×2): qty 2

## 2022-07-12 MED ORDER — IPRATROPIUM-ALBUTEROL 0.5-2.5 (3) MG/3ML IN SOLN
3.0000 mL | Freq: Four times a day (QID) | RESPIRATORY_TRACT | Status: DC
  Administered 2022-07-12: 3 mL via RESPIRATORY_TRACT
  Filled 2022-07-12: qty 3

## 2022-07-12 NOTE — Progress Notes (Signed)
PROGRESS NOTE    Kyle Hammond  GBT:[id number] DOB: 06/07/1956 DOA: 07/09/2022 PCP: Gwenlyn Perking, FNP    Brief Narrative:  66 year old male with a long history of alcohol abuse, hepatitis C, cirrhosis, tobacco use, admitted to the hospital with volume overload and right pleural effusion.  He was started on intravenous diuretics.  Echocardiogram performed showed EF of 20 to 25%.  Seen by cardiology who felt he was not a good candidate for aggressive measures and recommended palliative care conversations.  Hospital course complicated by development of worsening alcohol withdrawal.   Assessment & Plan:   Principal Problem:   Liver failure (HCC) Active Problems:   Alcohol dependence ([place])   Chronic hepatitis C without hepatic coma (HCC)   Tobacco use disorder, continuous   Pleural effusion on right   Decompensated liver cirrhosis -History of ongoing alcohol use as well as hepatitis C -Discriminant function for alcoholic hepatitis is elevated, but after discussing with Dr. Gala Romney, he would not be a candidate for prednisolone due to untreated hep C   Anasarca/right pleural effusion -Suspect this is secondary to decompensated liver cirrhosis -Family notes that overall swelling is better after receiving Lasix -Hold off on further Lasix for now due to borderline blood pressures    Chronic hepatitis C -Has followed up with GI in the past, but treatment was not pursued since he had ongoing alcohol use   Acute alcohol withdrawal with delirium tremens -History of alcohol withdrawal seizures -Continue CIWA protocol with Ativan -He is repeatedly trying to crawl out of bed -Continue monitoring in the stepdown unit -Precedex may be challenging since his blood pressure is already in the 90s. -I had a long conversation with the patient's daughter regarding further management of his withdrawal.  Patient has made it very clear that he does not have any intentions to stop drinking after he  leaves the hospital and will continue to drink for the rest of his life.  Although not routinely used, considering patient's current condition, worsening withdrawal, poor prognosis with comorbidities including cardiomyopathy and cirrhosis, repeated statements that he will resume drinking when able, and daughter's intentions to pursue a more palliative approach in his care, I offered to treat the patient with oral ethyl alcohol in addition to benzodiazepines in order to prevent worsening of his withdrawal.  Patient's daughter is in agreement with this approach. 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  Discussed patient's wishes regarding resuscitation at end-of-life including CPR and intubation/mechanical ventilation.  Patient made it clear that he would not wish to receive heroic measures including intubation or CPR.  DNR placed on chart. -We will need to discuss further goals of care including transitioning to palliative/hospice approach.  Further family will likely be able to discuss this on Monday.  This will also allow palliative care service to be involved in these conversations.   DVT prophylaxis: SCDs Start: 07/09/22 2339  Code Status: DNR Family Communication: Discussed with patient's daughter who is his main decision maker Disposition Plan: Status is: Inpatient Remains inpatient appropriate because: Continued management of alcohol withdrawal     Consultants:  Cardiology  Procedures:  Echo  Antimicrobials:       Subjective: Transferred to stepdown unit overnight.  He remains confused, hallucinating at times.  Objective: Vitals:   07/12/22 1152 07/12/22 1200 07/12/22 1300 07/12/22 1600  BP:  94/71 108/77 105/73  Pulse:  100 (!) 106 (!) 105  Resp:  (!) 21  (!) 26  Temp: 97.9 F (36.6 C)     TempSrc: Axillary     SpO2:  96% 97% 97%  Weight:      Height:        Intake/Output Summary (Last 24 hours) at 07/12/2022 1747 Last data filed at 07/12/2022 1603 Gross per 24 hour  Intake 357.77 ml  Output --  Net 357.77 ml   Filed Weights   07/10/22 0547 07/11/22 0631 07/12/22 0500  Weight: 57.6 kg 57 kg 57.2 kg    Examination:  General exam: Laying in bed, has mitts on his hands for safety Respiratory system: Clear to auscultation. Respiratory effort normal. Cardiovascular system: S1 & S2 heard, tachycardic. No JVD, murmurs, rubs, gallops or clicks. No pedal edema. Gastrointestinal system: Abdomen is nondistended, soft and nontender. No organomegaly or masses felt. Normal bowel sounds heard. Central nervous system: Confused at times, no focal deficits Extremities: Symmetric 5 x 5 power. Skin: No rashes, lesions or ulcers Psychiatry: He is confused, although he is calm now    Data Reviewed: I have personally reviewed following labs and imaging studies  CBC: Recent Labs  Lab 07/08/22 1435 07/09/22 1710 07/10/22 0257 07/11/22 0521 07/12/22 1002  WBC 3.1* 2.2* 3.4* 3.2* 2.8*  NEUTROABS 1.5 1.6* 1.9  --   --   HGB 11.9* 11.9* 10.9* 12.3* 12.4*  HCT 34.3* 33.3* 30.4* 34.8* 36.3*  MCV 102* 101.5* 99.7 103.3* 104.9*  PLT 90* 93* 87* 81* 72*   Basic Metabolic Panel: Recent Labs  Lab 07/08/22 1435 07/09/22 1710 07/10/22 0257 07/10/22 1312 07/10/22 2111 07/11/22 0521 07/12/22 1002  NA 132* 130* 133*  --   --  131* 129*  K 3.6 2.9* 3.1* 2.7* 4.5 4.3 3.6  CL 96 100 102  --   --  100 97*  CO2 19* 22 22  --   --  21* 24  GLUCOSE 80 113* 82  --   --  90 122*  BUN '8 11 11   '$ --   --  19 14  CREATININE 0.96 0.88 0.80  --   --  0.89 0.75  CALCIUM 7.5* 7.1* 6.6*  --   --  7.5* 7.6*  MG 0.7*  --  0.6* 1.6* 2.1 1.7 1.6*  PHOS  --   --   --   --   --  2.1* 2.3*   GFR: Estimated Creatinine Clearance: 74.5 mL/min (by C-G formula based on SCr of 0.75 mg/dL). Liver Function Tests: Recent Labs  Lab 07/08/22 1435 07/09/22 1710 07/10/22 0257 07/11/22 0521 07/12/22 1002  AST 226* 187* 168* 164*  --   ALT 71* 68* 61* 62*  --   ALKPHOS 60 45 40 42  --   BILITOT 6.7* 5.4* 4.6* 5.6*  --   PROT 8.1 8.3* 7.5 8.0  --   ALBUMIN 3.2* 2.7* 2.4* 2.6* 2.6*   No results for input(s): "LIPASE", "AMYLASE" in the last 168 hours. Recent Labs  Lab 07/09/22 1710 07/10/22 0257  AMMONIA 28 43*   Coagulation Profile: Recent Labs  Lab 07/09/22 1710 07/10/22 0257  INR 1.8* 1.8*   Cardiac Enzymes: No results for input(s): "CKTOTAL", "CKMB", "  CKMBINDEX", "TROPONINI" in the last 168 hours. BNP (last 3 results) No results for input(s): "PROBNP" in the last 8760 hours. HbA1C: No results for input(s): "HGBA1C" in the last 72 hours. CBG: No results for input(s): "GLUCAP" in the last 168 hours. Lipid Profile: No results for input(s): "CHOL", "HDL", "LDLCALC", "TRIG", "CHOLHDL", "LDLDIRECT" in the last 72 hours. Thyroid Function Tests: No results for input(s): "TSH", "T4TOTAL", "FREET4", "T3FREE", "THYROIDAB" in the last 72 hours. Anemia Panel: No results for input(s): "VITAMINB12", "FOLATE", "FERRITIN", "TIBC", "IRON", "RETICCTPCT" in the last 72 hours. Sepsis Labs: Recent Labs  Lab 07/09/22 1910 07/09/22 2352 07/10/22 0257 07/11/22 0521  PROCALCITON <0.10  --  <0.10 <0.10  LATICACIDVEN  --  2.4* 2.1*  --     Recent Results (from the past 240 hour(s))  MRSA Next Gen by PCR, Nasal     Status: None   Collection Time: 07/11/22  8:48 PM   Specimen: Nasal Mucosa; Nasal Swab  Result Value Ref Range Status   MRSA by PCR Next Gen NOT DETECTED NOT DETECTED Final    Comment:  (NOTE) The GeneXpert MRSA Assay (FDA approved for NASAL specimens only), is one component of a comprehensive MRSA colonization surveillance program. It is not intended to diagnose MRSA infection nor to guide or monitor treatment for MRSA infections. Test performance is not FDA approved in patients less than 7 years old. Performed at [facility], [address], [place], [place] [address]          Radiology Studies: No results found.      Scheduled Meds:  (feeding supplement) PROSource Plus  30 mL Oral TID BM   Chlorhexidine Gluconate Cloth  6 each Topical Daily   feeding supplement  237 mL Oral BID BM   folic acid  1 mg Oral Daily   ipratropium-albuterol  3 mL Nebulization BID   LORazepam  0-4 mg Intravenous Q4H   Followed by   Derrill Memo ON 07/13/2022] LORazepam  0-4 mg Intravenous Q12H   multivitamin with minerals  1 tablet Oral Daily   nicotine  21 mg Transdermal Daily   pantoprazole (PROTONIX) IV  40 mg Intravenous Q12H   spiritus frumenti  1 each Oral BID   thiamine  100 mg Oral Daily   Or   thiamine  100 mg Intravenous Daily   Continuous Infusions:  0.9 % NaCl with KCl 40 mEq / L 75 mL/hr at 07/12/22 1603     LOS: 3 days    Time spent: 35 mins    Kathie Dike, MD [facility]   If 7PM-7AM, please contact night-coverage [email or web address]  07/12/2022, 5:47 PM

## 2022-07-13 ENCOUNTER — Encounter (HOSPITAL_COMMUNITY): Payer: Self-pay | Admitting: Family Medicine

## 2022-07-13 DIAGNOSIS — F1029 Alcohol dependence with unspecified alcohol-induced disorder: Secondary | ICD-10-CM | POA: Diagnosis not present

## 2022-07-13 DIAGNOSIS — Z515 Encounter for palliative care: Secondary | ICD-10-CM

## 2022-07-13 DIAGNOSIS — B182 Chronic viral hepatitis C: Secondary | ICD-10-CM | POA: Diagnosis not present

## 2022-07-13 DIAGNOSIS — K7031 Alcoholic cirrhosis of liver with ascites: Secondary | ICD-10-CM | POA: Diagnosis not present

## 2022-07-13 DIAGNOSIS — Z7189 Other specified counseling: Secondary | ICD-10-CM

## 2022-07-13 DIAGNOSIS — K7290 Hepatic failure, unspecified without coma: Secondary | ICD-10-CM | POA: Diagnosis not present

## 2022-07-13 LAB — COMPREHENSIVE METABOLIC PANEL
ALT: 65 U/L — ABNORMAL HIGH (ref 0–44)
AST: 170 U/L — ABNORMAL HIGH (ref 15–41)
Albumin: 2.6 g/dL — ABNORMAL LOW (ref 3.5–5.0)
Alkaline Phosphatase: 49 U/L (ref 38–126)
Anion gap: 6 (ref 5–15)
BUN: 13 mg/dL (ref 8–23)
CO2: 19 mmol/L — ABNORMAL LOW (ref 22–32)
Calcium: 7.6 mg/dL — ABNORMAL LOW (ref 8.9–10.3)
Chloride: 107 mmol/L (ref 98–111)
Creatinine, Ser: 0.73 mg/dL (ref 0.61–1.24)
GFR, Estimated: >60 mL/min (ref >60)
Glucose, Bld: 103 mg/dL — ABNORMAL HIGH (ref 70–99)
Potassium: 5.3 mmol/L — ABNORMAL HIGH (ref 3.5–5.1)
Sodium: 132 mmol/L — ABNORMAL LOW (ref 135–145)
Total Bilirubin: 7.2 mg/dL — ABNORMAL HIGH (ref 0.3–1.2)
Total Protein: 8.1 g/dL (ref 6.5–8.1)

## 2022-07-13 LAB — CBC
HCT: 36.4 % — ABNORMAL LOW (ref 39.0–52.0)
Hemoglobin: 12.5 g/dL — ABNORMAL LOW (ref 13.0–17.0)
MCH: 35.9 pg — ABNORMAL HIGH (ref 26.0–34.0)
MCHC: 34.3 g/dL (ref 30.0–36.0)
MCV: 104.6 fL — ABNORMAL HIGH (ref 80.0–100.0)
Platelets: 87 10*3/uL — ABNORMAL LOW (ref 150–400)
RBC: 3.48 MIL/uL — ABNORMAL LOW (ref 4.22–5.81)
RDW: 17.2 % — ABNORMAL HIGH (ref 11.5–15.5)
WBC: 3.8 10*3/uL — ABNORMAL LOW (ref 4.0–10.5)
nRBC: 0.5 % — ABNORMAL HIGH (ref 0.0–0.2)

## 2022-07-13 LAB — MAGNESIUM: Magnesium: 2.1 mg/dL (ref 1.7–2.4)

## 2022-07-13 MED ORDER — LORAZEPAM 1 MG PO TABS
1.0000 mg | ORAL_TABLET | ORAL | Status: AC | PRN
  Administered 2022-07-15: 2 mg via ORAL
  Filled 2022-07-13: qty 2

## 2022-07-13 MED ORDER — LORAZEPAM 2 MG/ML IJ SOLN
1.0000 mg | INTRAMUSCULAR | Status: AC | PRN
  Administered 2022-07-13 – 2022-07-15 (×3): 2 mg via INTRAVENOUS
  Administered 2022-07-15: 1 mg via INTRAVENOUS
  Filled 2022-07-13 (×3): qty 1

## 2022-07-13 NOTE — Consult Note (Signed)
Consultation Note Date: 07/13/2022   Patient Name: Kyle Hammond  DOB: [birth year]  MRN: [id number]  Age / Sex: 66 y.o., male  PCP: Kyle Perking, FNP Referring Physician: Kathie Dike, MD  Reason for Consultation: Establishing goals of care  HPI/Patient Profile: 66 y.o. male  with past medical history of cirrhosis of liver, hepatitis C, active with local GI service but not followed up, alcohol use disorder, tobacco use disorder, left hip fracture surgery 2017, bilateral cataracts 2019, admitted on 07/09/2022 with liver failure/decompensated liver cirrhosis.   Clinical Assessment and Goals of Care: I have reviewed medical records including EPIC notes, labs and imaging, received report from RN, assessed the patient.  Kyle Hammond is lying quietly in bed.  He appears acutely/chronically ill and quite frail.  He will briefly make an somewhat keep eye contact.  He is alert, oriented to self only at this time.  I am not sure that he can make his basic needs known.  There is no family at bedside at this time.  Kyle Hammond tells me that he would like to return home so that he may feed his chickens.  He tells me he has 2 cats, 2 goats, 6 chickens, 2 dogs.  I reassured him that his daughter is caring for his animals.  I return in the afternoon to find that daughter, Kyle Hammond, is present at bedside.  Present today also is former wife, Kyle Hammond and sister, Kyle Hammond.  We go to the palliative team office for private discussions.  We meet to discuss diagnosis prognosis, GOC, EOL wishes, disposition and options.  I introduced Palliative Medicine as specialized medical care for people living with serious illness. It focuses on providing relief from the symptoms and stress of a serious illness. The goal is to improve quality of life for both the patient and the family.  We discussed a brief life review of the patient.   Kyle Hammond now prefers to go by his father's name, his first name, Kyle Hammond.  He worked as a Optometrist for many years.  He has been disabled due to alcoholism for approximately 15+ years.  He is divorced, but still has a decent relationship with his former wife, Kyle Hammond, who assists in getting groceries at times.  His son died last year, seemingly of alcohol abuse.  Family shares that he has gone downhill since then.  We then focused on their current illness.  Overall, family seems knowledgeable about Kyle Hammond acute and chronic health burdens.  They share his desire to live independently.  They shared that he has made them aware of his health concerns, and his goals and desires.  MELD score of 20% mortality in 3 months.  We talk about GI service recommendations and previous appointments.  We talk about alcohol withdrawal.  The natural disease trajectory and expectations at EOL were discussed.  We talk about a few "what if's and maybe's".  Hospice and Palliative Care services outpatient were explained and offered.  We talk about at home "  treat the treatable" hospice care if possible.  We talk about residential hospice.  The difference between aggressive medical intervention and comfort care was considered in light of the patient's goals of care.   Advanced directives, concepts specific to code status, artifical feeding and hydration, and rehospitalization were considered and discussed.  All family endorse DNR.  Discussed the importance of continued conversation with family and the medical providers regarding overall plan of care and treatment options, ensuring decisions are within the context of the patient's values and GOCs.  Questions and concerns were addressed.   The family was encouraged to call with questions or concerns.  PMT will continue to support holistically.  Conference with attending, bedside nursing staff, transitional care team related to patient condition, needs, goals of care,  disposition.   HCPOA NEXT OF KIN -only living child, daughter Kyle Hammond.  Former wife Kyle Hammond and sister Kyle Hammond offer good support for Kyle Hammond.  Kyle Hammond son died last year.    SUMMARY OF RECOMMENDATIONS   Continue to treat the treatable Time for outcomes States would take short-term rehab if improves/PNC in the past Considering hospice care if no improvement   Code Status/Advance Care Planning: DNR -it is noted that Kyle Hammond has been DNR since at least 2017.  Symptom Management:  Per hospitalist, no additional needs at this time.  Palliative Prophylaxis:  Delirium Protocol, Frequent Pain Assessment, Oral Care, and Turn Reposition  Additional Recommendations (Limitations, Scope, Preferences): At this point continue to treat the treatable but no CPR or intubation.  Time for outcomes.  Psycho-social/Spiritual:  Desire for further Chaplaincy support:no Additional Recommendations: Caregiving  Support/Resources and Education on Hospice  Prognosis:  Unable to determine, based on outcomes.  Guarded.  The next few days to weeks should show if he is able to have meaningful recovery.  Discharge Planning: To Be Determined      Primary Diagnoses: Present on Admission:  Liver failure ([place])  Tobacco use disorder, continuous  Chronic hepatitis C without hepatic coma ([place])   I have reviewed the medical record, interviewed the patient and family, and examined the patient. The following aspects are pertinent.  Past Medical History:  Diagnosis Date   Cataract    Cirrhosis of liver ([place])    Hepatitis C    Liver lesion    Social History   Socioeconomic History   Marital status: Divorced    Spouse name: Not on file   Number of children: 2   Years of education: 12   Highest education level: High school graduate  Occupational History   Occupation: disabled  Tobacco Use   Smoking status: Every Day    Packs/day: 1.00    Years: 50.00    Total pack years: 50.00    Types:  Cigarettes   Smokeless tobacco: Never  Vaping Use   Vaping Use: Never used  Substance and Sexual Activity   Alcohol use: Yes    Alcohol/week: 12.0 standard drinks of alcohol    Types: 12 Cans of beer per week   Drug use: No   Sexual activity: Not Currently  Other Topics Concern   Not on file  Social History Narrative   Not on file   Social Determinants of Health   Financial Resource Strain: Not on file  Food Insecurity: No Food Insecurity (07/10/2022)   Hunger Vital Sign    Worried About Running Out of Food in the Last Year: Never true    Ran Out of Food in the Last Year: Never  true  Transportation Needs: No Transportation Needs (07/10/2022)   PRAPARE - Hydrologist (Medical): No    Lack of Transportation (Non-Medical): No  Physical Activity: Not on file  Stress: Not on file  Social Connections: Not on file   Family History  Problem Relation Age of Onset   Cancer Mother        bone   Heart disease Father    Heart attack Father    Colon cancer Neg Hx    Colon polyps Neg Hx    Scheduled Meds:  (feeding supplement) PROSource Plus  30 mL Oral TID BM   Chlorhexidine Gluconate Cloth  6 each Topical Daily   feeding supplement  237 mL Oral BID BM   folic acid  1 mg Oral Daily   ipratropium-albuterol  3 mL Nebulization BID   LORazepam  0-4 mg Intravenous Q4H   Followed by   LORazepam  0-4 mg Intravenous Q12H   multivitamin with minerals  1 tablet Oral Daily   nicotine  21 mg Transdermal Daily   pantoprazole (PROTONIX) IV  40 mg Intravenous Q12H   spiritus frumenti  1 each Oral BID   thiamine  100 mg Oral Daily   Or   thiamine  100 mg Intravenous Daily   Continuous Infusions: PRN Meds:.LORazepam **OR** LORazepam, ondansetron **OR** ondansetron (ZOFRAN) IV, oxyCODONE Medications Prior to Admission:  Prior to Admission medications   Medication Sig Start Date End Date Taking? Authorizing Provider  albuterol (VENTOLIN HFA) 108 (90 Base)  MCG/ACT inhaler Inhale 2 puffs into the lungs every 6 (six) hours as needed for wheezing or shortness of breath.   Yes [provider]  DOXYCYCLINE HYCLATE PO Take 100 mg by mouth in the morning and at bedtime. For 10 day supply. Start date 07/08/22   Yes [provider]  furosemide (LASIX) 20 MG tablet Take 20 mg by mouth daily. For 3 days supply. Start date 07/08/22   Yes [provider]  predniSONE (DELTASONE) 10 MG tablet Take 20 mg by mouth daily with breakfast. For 5 days Start on 07/08/22   Yes [provider]   No Known Allergies Review of Systems  Unable to perform ROS: Mental status change    Physical Exam Vitals and nursing note reviewed.  Cardiovascular:     Rate and Rhythm: Normal rate.  Pulmonary:     Effort: Pulmonary effort is normal. No respiratory distress.  Musculoskeletal:     Comments: Frail and thin  Neurological:     Mental Status: He is alert.     Comments: Oriented to self only at this time  Psychiatric:     Comments: Calm and cooperative at this time     Vital Signs: BP 100/73   Pulse 91   Temp 97.8 F (36.6 C) (Axillary)   Resp (!) 21   Ht '5\' 6"'$  (1.676 m)   Wt 60.3 kg   SpO2 98%   BMI 21.46 kg/m  Pain Scale: 0-10 POSS *See Group Information*: 2-Acceptable,Slightly drowsy, easily aroused Pain Score: 0-No pain   SpO2: SpO2: 98 % O2 Device:SpO2: 98 % O2 Flow Rate: .   IO: Intake/output summary:  Intake/Output Summary (Last 24 hours) at 07/13/2022 1301 Last data filed at 07/13/2022 0036 Gross per 24 hour  Intake 591.42 ml  Output 630 ml  Net -38.58 ml    LBM: Last BM Date : 07/07/22 Baseline Weight: Weight: 59.9 kg Most recent weight: Weight: 60.3 kg  Palliative Assessment/Data:   Flowsheet Rows    Flowsheet Row Most Recent Value  Intake Tab   Referral Department Hospitalist  Unit at Time of Referral Intermediate Care Unit  Palliative Care Primary Diagnosis Other (Comment)  Date Notified  07/11/22  Palliative Care Type New Palliative care  Reason for referral Clarify Goals of Care  Date of Admission 07/09/22  Date first seen by Palliative Care 07/13/22  # of days Palliative referral response time 2 Day(s)  # of days IP prior to Palliative referral 2  Clinical Assessment   Palliative Performance Scale Score 30%  Pain Max last 24 hours Not able to report  Pain Min Last 24 hours Not able to report  Dyspnea Max Last 24 Hours Not able to report  Dyspnea Min Last 24 hours Not able to report  Psychosocial & Spiritual Assessment   Palliative Care Outcomes        Time In: 1020 Time Out: 1135 Time Total: 75 minutes  Greater than 50%  of this time was spent counseling and coordinating care related to the above assessment and plan.  Signed by: Drue Novel, NP   Please contact Palliative Medicine Team phone at [phone] for questions and concerns.  For individual provider: See Shea Evans

## 2022-07-13 NOTE — Progress Notes (Signed)
PROGRESS NOTE    Kyle Hammond  Hammond DOB: September 14, 1956 DOA: 07/09/2022 PCP: Gwenlyn Perking, FNP    Brief Narrative:  66 year old male with a long history of alcohol abuse, hepatitis C, cirrhosis, tobacco use, admitted to the hospital with volume overload and right pleural effusion.  He was started on intravenous diuretics.  Echocardiogram performed showed EF of 20 to 25%.  Seen by cardiology who felt he was not a good candidate for aggressive measures and recommended palliative care conversations.  Hospital course complicated by development of worsening alcohol withdrawal.   Assessment & Plan:   Principal Problem:   Liver failure ([place]) Active Problems:   Alcohol dependence ([place])   Chronic hepatitis C without hepatic coma (HCC)   Tobacco use disorder, continuous   Pleural effusion on right   Decompensated liver cirrhosis -History of ongoing alcohol use as well as hepatitis C -Discriminant function for alcoholic hepatitis is elevated, but after discussing with Dr. Gala Romney, he would not be a candidate for prednisolone due to untreated hep C -Reviewed with GI, Roseanne Kaufman NP who had seen patient in GI clinic. She was also in agreement that pursuing a palliative/hospice approach for this patient would be the most reasonable course of action and did not recommend further liver work up.   Anasarca/right pleural effusion -Suspect this is secondary to decompensated liver cirrhosis -Family notes that overall swelling is better after receiving Lasix -Hold off on further Lasix for now due to borderline blood pressures    Chronic hepatitis C -Has followed up with GI in the past, but treatment was not pursued since he had ongoing alcohol use   Acute alcohol withdrawal with delirium tremens -History of alcohol withdrawal seizures -Continue CIWA protocol with Ativan -He is repeatedly trying to crawl out of bed -Continue monitoring in the stepdown unit -Precedex may be challenging  since his blood pressure is already in the 90s. -I had a long conversation with the patient's daughter regarding further management of his withdrawal.  Patient has made it very clear that he does not have any intentions to stop drinking after he leaves the hospital and will continue to drink for the rest of his life.  Although not routinely used, considering patient's current condition, worsening withdrawal, poor prognosis with comorbidities including cardiomyopathy and cirrhosis, repeated statements that he will resume drinking when able, and daughter's intentions to pursue a more palliative approach in his care, I offered to treat the patient with oral ethyl alcohol in addition to benzodiazepines in order to prevent worsening of his withdrawal.  Patient's daughter is in agreement with this approach. 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  Discussed patient's wishes regarding resuscitation at end-of-life including CPR and intubation/mechanical ventilation.  Patient made it clear that he would not wish to receive heroic measures including intubation or CPR.  DNR placed on chart. -palliative care consulted to help address further goals of care   DVT prophylaxis: SCDs Start: 07/09/22 2339  Code Status: DNR Family Communication: Discussed with patient's daughter who is his main decision maker Disposition Plan: Status is:  Inpatient Remains inpatient appropriate because: Continued management of  alcohol withdrawal, discussions around goals of care     Consultants:  Cardiology Palliative care  Procedures:  Echo  Antimicrobials:      Subjective: He is sleeping on my arrival but wakes up to voice. He is confused, asking if he can go home  Objective: Vitals:   07/13/22 0732 07/13/22 0741 07/13/22 0800 07/13/22 0900  BP:   98/75 99/82  Pulse:  94 91 92  Resp:  (!) 25 (!) 27 (!) 24  Temp:  97.6 F (36.4 C)    TempSrc:  Oral    SpO2: 98% 96% 98% 98%  Weight:      Height:        Intake/Output Summary (Last 24 hours) at 07/13/2022 1028 Last data filed at 07/13/2022 0036 Gross per 24 hour  Intake 831.42 ml  Output 630 ml  Net 201.42 ml   Filed Weights   07/11/22 0631 07/12/22 0500 07/13/22 0420  Weight: 57 kg 57.2 kg 60.3 kg    Examination:  General exam: Laying in bed, has mitts on his hands for safety Respiratory system: coarse breath sounds bilaterally. Respiratory effort normal. Cardiovascular system: S1 & S2 heard, tachycardic. No JVD, murmurs, rubs, gallops or clicks. No pedal edema. Gastrointestinal system: Abdomen is nondistended, soft and nontender. No organomegaly or masses felt. Normal bowel sounds heard. Central nervous system: Confused at times, no focal deficits Extremities: Symmetric 5 x 5 power. Skin: No rashes, lesions or ulcers Psychiatry: He is confused, although he is calm now    Data Reviewed: I have personally reviewed following labs and imaging studies  CBC: Recent Labs  Lab 07/08/22 1435 07/09/22 1710 07/10/22 0257 07/11/22 0521 07/12/22 1002 07/13/22 0412  WBC 3.1* 2.2* 3.4* 3.2* 2.8* 3.8*  NEUTROABS 1.5 1.6* 1.9  --   --   --   HGB 11.9* 11.9* 10.9* 12.3* 12.4* 12.5*  HCT 34.3* 33.3* 30.4* 34.8* 36.3* 36.4*  MCV 102* 101.5* 99.7 103.3* 104.9* 104.6*  PLT 90* 93* 87* 81* 72* 87*   Basic Metabolic Panel: Recent Labs  Lab 07/09/22 1710 07/10/22 0257 07/10/22 1312 07/10/22 2111 07/11/22 0521 07/12/22 1002  07/13/22 0412  NA 130* 133*  --   --  131* 129* 132*  K 2.9* 3.1* 2.7* 4.5 4.3 3.6 5.3*  CL 100 102  --   --  100 97* 107  CO2 22 22  --   --  21* 24 19*  GLUCOSE 113* 82  --   --  90 122* 103*  BUN 11 11  --   --  '19 14 13  '$ CREATININE 0.88 0.80  --   --  0.89 0.75 0.73  CALCIUM 7.1* 6.6*  --   --  7.5* 7.6* 7.6*  MG  --  0.6* 1.6* 2.1 1.7 1.6* 2.1  PHOS  --   --   --   --  2.1* 2.3*  --    GFR: Estimated Creatinine Clearance: 78.5 mL/min (by C-G formula based on SCr of 0.73 mg/dL). Liver Function Tests: Recent Labs  Lab 07/08/22 1435 07/09/22 1710 07/10/22 0257 07/11/22 0521 07/12/22 1002 07/13/22 0412  AST 226* 187* 168* 164*  --  170*  ALT 71* 68* 61* 62*  --  65*  ALKPHOS 60 45 40 42  --  49  BILITOT 6.7* 5.4* 4.6* 5.6*  --  7.2*  PROT 8.1 8.3* 7.5 8.0  --  8.1  ALBUMIN 3.2* 2.7* 2.4* 2.6* 2.6*  2.6*   No results for input(s): "LIPASE", "AMYLASE" in the last 168 hours. Recent Labs  Lab 07/09/22 1710 07/10/22 0257  AMMONIA 28 43*   Coagulation Profile: Recent Labs  Lab 07/09/22 1710 07/10/22 0257  INR 1.8* 1.8*   Cardiac Enzymes: No results for input(s): "CKTOTAL", "CKMB", "CKMBINDEX", "TROPONINI" in the last 168 hours. BNP (last 3 results) No results for input(s): "PROBNP" in the last 8760 hours. HbA1C: No results for input(s): "HGBA1C" in the last 72 hours. CBG: No results for input(s): "GLUCAP" in the last 168 hours. Lipid Profile: No results for input(s): "CHOL", "HDL", "LDLCALC", "TRIG", "CHOLHDL", "LDLDIRECT" in the last 72 hours. Thyroid Function Tests: No results for input(s): "TSH", "T4TOTAL", "FREET4", "T3FREE", "THYROIDAB" in the last 72 hours. Anemia Panel: No results for input(s): "VITAMINB12", "FOLATE", "FERRITIN", "TIBC", "IRON", "RETICCTPCT" in the last 72 hours. Sepsis Labs: Recent Labs  Lab 07/09/22 1910 07/09/22 2352 07/10/22 0257 07/11/22 0521  PROCALCITON <0.10  --  <0.10 <0.10  LATICACIDVEN  --  2.4* 2.1*  --     Recent  Results (from the past 240 hour(s))  MRSA Next Gen by PCR, Nasal     Status: None   Collection Time: 07/11/22  8:48 PM   Specimen: Nasal Mucosa; Nasal Swab  Result Value Ref Range Status   MRSA by PCR Next Gen NOT DETECTED NOT DETECTED Final    Comment: (NOTE) The GeneXpert MRSA Assay (FDA approved for NASAL specimens only), is one component of a comprehensive MRSA colonization surveillance program. It is not intended to diagnose MRSA infection nor to guide or monitor treatment for MRSA infections. Test performance is not FDA approved in patients less than 22 years old. Performed at [facility], [address] [place], [place] [address]          Radiology Studies: No results found.      Scheduled Meds:  (feeding supplement) PROSource Plus  30 mL Oral TID BM   Chlorhexidine Gluconate Cloth  6 each Topical Daily   feeding supplement  237 mL Oral BID BM   folic acid  1 mg Oral Daily   ipratropium-albuterol  3 mL Nebulization BID   LORazepam  0-4 mg Intravenous Q4H   Followed by   LORazepam  0-4 mg Intravenous Q12H   multivitamin with minerals  1 tablet Oral Daily   nicotine  21 mg Transdermal Daily   pantoprazole (PROTONIX) IV  40 mg Intravenous Q12H   spiritus frumenti  1 each Oral BID   thiamine  100 mg Oral Daily   Or   thiamine  100 mg Intravenous Daily   Continuous Infusions:     LOS: 4 days    Time spent: 35 mins    Kathie Dike, MD [facility]   If 7PM-7AM, please contact night-coverage [email or web address]  07/13/2022, 10:28 AM

## 2022-07-14 DIAGNOSIS — K7031 Alcoholic cirrhosis of liver with ascites: Secondary | ICD-10-CM | POA: Diagnosis not present

## 2022-07-14 DIAGNOSIS — K7290 Hepatic failure, unspecified without coma: Secondary | ICD-10-CM | POA: Diagnosis not present

## 2022-07-14 DIAGNOSIS — Z7189 Other specified counseling: Secondary | ICD-10-CM | POA: Diagnosis not present

## 2022-07-14 DIAGNOSIS — Z515 Encounter for palliative care: Secondary | ICD-10-CM | POA: Diagnosis not present

## 2022-07-14 LAB — COMPREHENSIVE METABOLIC PANEL
ALT: 60 U/L — ABNORMAL HIGH (ref 0–44)
AST: 133 U/L — ABNORMAL HIGH (ref 15–41)
Albumin: 2.4 g/dL — ABNORMAL LOW (ref 3.5–5.0)
Alkaline Phosphatase: 47 U/L (ref 38–126)
Anion gap: 5 (ref 5–15)
BUN: 13 mg/dL (ref 8–23)
CO2: 22 mmol/L (ref 22–32)
Calcium: 7.5 mg/dL — ABNORMAL LOW (ref 8.9–10.3)
Chloride: 106 mmol/L (ref 98–111)
Creatinine, Ser: 0.79 mg/dL (ref 0.61–1.24)
GFR, Estimated: >60 mL/min (ref >60)
Glucose, Bld: 123 mg/dL — ABNORMAL HIGH (ref 70–99)
Potassium: 3.9 mmol/L (ref 3.5–5.1)
Sodium: 133 mmol/L — ABNORMAL LOW (ref 135–145)
Total Bilirubin: 6 mg/dL — ABNORMAL HIGH (ref 0.3–1.2)
Total Protein: 7.4 g/dL (ref 6.5–8.1)

## 2022-07-14 MED ORDER — POTASSIUM CHLORIDE 10 MEQ/100ML IV SOLN
10.0000 meq | INTRAVENOUS | Status: AC
  Administered 2022-07-14 – 2022-07-15 (×4): 10 meq via INTRAVENOUS
  Filled 2022-07-14 (×4): qty 100

## 2022-07-14 MED ORDER — SPIRITUS FRUMENTI
1.0000 | Freq: Every day | ORAL | Status: DC
  Filled 2022-07-14: qty 1

## 2022-07-14 NOTE — Progress Notes (Signed)
Palliative: [name], Kyle Hammond, is lying quietly in bed.  He appears acutely/chronically ill and quite frail.  He is resting comfortably, but will briefly stir when I call his name and touch his arm.  He does not make or keep eye contact.  I do not believe that he can make his basic needs known.  His sister, Kyle Hammond, is present at bedside.  Kyle Hammond tells me that she believes that Kyle Hammond is "better" today.  She states that he recognized her, calling her by name when she entered, and has eaten some food.  We talk about meaningful improvements.  Kyle Hammond shares a story of her husband's recent passing about 2 months ago.  She shares that he did not want to return to the hospital or see physicians.  He asked her to respect his wish to stay at home.  She tells a story of his peaceful passing at home.  Conference with attending, bedside nursing staff, transition of care team related to patient condition, needs, goals of care, disposition.  Plan:   At this point continue to treat the treatable but no CPR or intubation.  Time for outcomes.        25 minutes  Quinn Axe, NP Palliative medicine team Team phone [phone] Greater than 50% of this time was spent counseling and coordinating care related to the above assessment and plan.

## 2022-07-14 NOTE — Progress Notes (Addendum)
PROGRESS NOTE    Kyle Hammond  CBU:[id number] DOB: 10-11-1955 DOA: 07/09/2022 PCP: Gwenlyn Perking, FNP    Brief Narrative:  66 year old male with a long history of alcohol abuse, hepatitis C, cirrhosis, tobacco use, admitted to the hospital with volume overload and right pleural effusion.  He was started on intravenous diuretics.  Echocardiogram performed showed EF of 20 to 25%.  Seen by cardiology who felt he was not a good candidate for aggressive measures and recommended palliative care conversations.  Hospital course complicated by development of worsening alcohol withdrawal.   Assessment & Plan:   Principal Problem:   Liver failure ([place]) Active Problems:   Alcohol dependence ([place])   Chronic hepatitis C without hepatic coma (HCC)   Tobacco use disorder, continuous   Pleural effusion on right   Decompensated liver cirrhosis -History of ongoing alcohol use as well as hepatitis C -Discriminant function for alcoholic hepatitis is elevated, but after discussing with Dr. Gala Romney, he would not be a candidate for prednisolone due to untreated hep C -Reviewed with GI, Roseanne Kaufman NP who had seen patient in GI clinic. She was also in agreement that pursuing a palliative/hospice approach for this patient would be the most reasonable course of action and did not recommend further liver work up.   Anasarca/right pleural effusion -Suspect this is secondary to decompensated liver cirrhosis -Family notes that overall swelling is better after receiving Lasix -Hold off on further Lasix for now due to borderline blood pressures    Chronic hepatitis C -Has followed up with GI in the past, but treatment was not pursued since he had ongoing alcohol use   Acute alcohol withdrawal with delirium tremens -History of alcohol withdrawal seizures -Continue CIWA protocol with Ativan -He is repeatedly trying to crawl out of bed -Continue monitoring in the stepdown unit -Precedex may be challenging  since his blood pressure is already in the 90s. 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  Discussed patient's wishes regarding resuscitation at end-of-life including CPR and intubation/mechanical ventilation.  Patient made it clear that he would not wish to receive heroic measures including intubation or CPR.  DNR placed on chart. -palliative care following for goals of care -His daughter has expressed desire for patient to go to skilled nursing facility for rehab and feels that she may be able to convince him to go.  He has not been coherent enough to be involved in these decisions as of yet -time for outcomes   DVT prophylaxis: SCDs Start: 07/09/22 2339  Code Status: DNR Family Communication: Discussed with patient's daughter who is his main decision maker 10/31 Disposition Plan: Status is: Inpatient Remains inpatient appropriate because: Continued management of alcohol withdrawal, discussions around goals of care     Consultants:  Cardiology Palliative care  Procedures:  Echo  Antimicrobials:       Subjective: Patient wakes up to voice. He thinks he is in [facility]. Denies any complaints  Objective: Vitals:   07/14/22 1700 07/14/22 1800 07/14/22 1900 07/14/22 2011  BP: (!) 89/71  94/79   Pulse: 95 95 95   Resp: (!) 21 20 (!) 21   Temp:      TempSrc:      SpO2: 94% 96% 92% 94%  Weight:      Height:        Intake/Output Summary (Last 24 hours) at 07/14/2022 2025 Last data filed at 07/14/2022 1611 Gross per 24 hour  Intake 600 ml  Output 1065 ml  Net -465 ml   Filed Weights   07/12/22 0500 07/13/22 0420 07/14/22 0500  Weight: 57.2 kg 60.3 kg 57.8 kg    Examination:  General exam: Laying in bed, has mitts on his hands for safety Respiratory system: coarse breath sounds bilaterally. Respiratory effort normal. Cardiovascular system: S1 & S2 heard, tachycardic. No JVD, murmurs, rubs, gallops or clicks. No pedal edema. Gastrointestinal system: Abdomen is nondistended, soft and nontender. No organomegaly or masses felt. Normal bowel sounds heard. Central nervous system: Confused at times, no focal deficits Extremities: Symmetric 5 x 5 power. Skin: No rashes, lesions or ulcers Psychiatry: He is confused, although he is calm now    Data Reviewed: I have personally reviewed following labs and imaging studies  CBC: Recent Labs  Lab 07/08/22 1435 07/09/22 1710 07/09/22 1710 07/10/22 0257 07/11/22 0521 07/12/22 1002 07/13/22 0412 07/14/22 1004  WBC 3.1* 2.2*   < > 3.4* 3.2* 2.8* 3.8* 3.4*  NEUTROABS 1.5 1.6*  --  1.9  --   --   --   --   HGB 11.9* 11.9*   < > 10.9* 12.3* 12.4* 12.5* 12.0*  HCT 34.3* 33.3*   < > 30.4* 34.8* 36.3* 36.4* 35.7*  MCV 102* 101.5*   < > 99.7 103.3* 104.9* 104.6* 108.5*  PLT 90* 93*   < > 87* 81* 72* 87* 77*   < > = values in this interval not displayed.   Basic Metabolic Panel: Recent Labs  Lab 07/10/22 0257 07/10/22 1312 07/10/22 2111 07/11/22 0521 07/12/22 1002 07/13/22 0412 07/14/22 1004  NA 133*  --   --  131*  129* 132* 133*  K 3.1* 2.7* 4.5 4.3 3.6 5.3* 3.9  CL 102  --   --  100 97* 107 106  CO2 22  --   --  21* 24 19* 22  GLUCOSE 82  --   --  90 122* 103* 123*  BUN 11  --   --  '19 14 13 13  '$ CREATININE 0.80  --   --  0.89 0.75 0.73 0.79  CALCIUM 6.6*  --   --  7.5* 7.6* 7.6* 7.5*  MG 0.6* 1.6* 2.1 1.7 1.6* 2.1  --   PHOS  --   --   --  2.1* 2.3*  --   --    GFR: Estimated Creatinine Clearance: 75.3 mL/min (by C-G formula based on SCr of 0.79 mg/dL). Liver Function Tests: Recent Labs  Lab 07/09/22 1710 07/10/22 0257 07/11/22 0521 07/12/22 1002 07/13/22 0412 07/14/22 1004  AST 187* 168* 164*  --  170* 133*  ALT 68* 61* 62*  --  65* 60*  ALKPHOS 45 40 42  --  49 47  BILITOT 5.4* 4.6* 5.6*  --  7.2* 6.0*  PROT 8.3* 7.5 8.0  --  8.1 7.4  ALBUMIN 2.7* 2.4* 2.6* 2.6* 2.6* 2.4*   No results for input(s): "LIPASE", "AMYLASE" in the last 168 hours. Recent Labs  Lab 07/09/22 1710 07/10/22 0257  AMMONIA 28 43*   Coagulation Profile: Recent Labs  Lab 07/09/22 1710 07/10/22 0257  INR 1.8* 1.8*   Cardiac Enzymes: No results for input(s): "CKTOTAL", "CKMB", "CKMBINDEX", "TROPONINI" in the last 168 hours. BNP (last 3 results) No results for input(s): "PROBNP" in the last 8760 hours. HbA1C: No results for input(s): "HGBA1C" in the last 72 hours. CBG: No results for input(s): "GLUCAP" in the last 168 hours. Lipid Profile: No results for input(s): "CHOL", "HDL", "LDLCALC", "TRIG", "CHOLHDL", "LDLDIRECT" in the last 72 hours. Thyroid Function Tests: No results for input(s): "TSH", "T4TOTAL", "FREET4", "T3FREE", "THYROIDAB" in the last 72 hours. Anemia Panel: No results for input(s): "VITAMINB12", "FOLATE", "FERRITIN", "TIBC", "IRON", "RETICCTPCT" in the last 72 hours. Sepsis Labs: Recent Labs  Lab 07/09/22 1910 07/09/22 2352 07/10/22 0257 07/11/22 0521  PROCALCITON <0.10  --  <0.10 <0.10  LATICACIDVEN  --  2.4* 2.1*  --     Recent Results (from the past 240 hour(s))  MRSA  Next Gen by PCR, Nasal     Status: None   Collection Time: 07/11/22  8:48 PM   Specimen: Nasal Mucosa; Nasal Swab  Result Value Ref Range Status   MRSA by PCR Next Gen NOT DETECTED NOT DETECTED Final    Comment: (NOTE) The GeneXpert MRSA Assay (FDA approved for NASAL specimens only), is one component of a comprehensive MRSA colonization surveillance program. It is not intended to diagnose MRSA infection nor to guide or monitor treatment for MRSA infections. Test performance is not FDA approved in patients less than 9 years old. Performed at [facility], [address] [place], [place] [address]          Radiology Studies: No results found.      Scheduled Meds:  (feeding supplement) PROSource Plus  30 mL Oral TID BM   Chlorhexidine Gluconate Cloth  6 each Topical Daily   feeding supplement  237 mL Oral BID BM   folic acid  1 mg Oral Daily   ipratropium-albuterol  3 mL Nebulization BID   LORazepam  0-4 mg Intravenous Q12H   multivitamin with minerals  1 tablet Oral Daily   nicotine  21 mg Transdermal Daily   pantoprazole (PROTONIX) IV  40 mg Intravenous Q12H   [START ON 07/15/2022] spiritus frumenti  1 each Oral Daily   thiamine  100 mg Oral Daily   Or   thiamine  100 mg Intravenous Daily   Continuous Infusions:     LOS: 5 days    Time spent: 35 mins    Kathie Dike, MD [facility]   If 7PM-7AM, please contact night-coverage [email or web address]  07/14/2022, 8:25 PM

## 2022-07-15 DIAGNOSIS — Z7189 Other specified counseling: Secondary | ICD-10-CM | POA: Diagnosis not present

## 2022-07-15 DIAGNOSIS — Z515 Encounter for palliative care: Secondary | ICD-10-CM | POA: Diagnosis not present

## 2022-07-15 DIAGNOSIS — K7210 Chronic hepatic failure without coma: Secondary | ICD-10-CM

## 2022-07-15 DIAGNOSIS — K7031 Alcoholic cirrhosis of liver with ascites: Secondary | ICD-10-CM | POA: Diagnosis not present

## 2022-07-15 DIAGNOSIS — K7290 Hepatic failure, unspecified without coma: Secondary | ICD-10-CM | POA: Diagnosis not present

## 2022-07-15 LAB — COMPREHENSIVE METABOLIC PANEL
ALT: 66 U/L — ABNORMAL HIGH (ref 0–44)
AST: 150 U/L — ABNORMAL HIGH (ref 15–41)
Albumin: 2.5 g/dL — ABNORMAL LOW (ref 3.5–5.0)
Alkaline Phosphatase: 53 U/L (ref 38–126)
Anion gap: 3 — ABNORMAL LOW (ref 5–15)
BUN: 13 mg/dL (ref 8–23)
CO2: 22 mmol/L (ref 22–32)
Calcium: 7.8 mg/dL — ABNORMAL LOW (ref 8.9–10.3)
Chloride: 107 mmol/L (ref 98–111)
Creatinine, Ser: 0.84 mg/dL (ref 0.61–1.24)
GFR, Estimated: >60 mL/min (ref >60)
Glucose, Bld: 104 mg/dL — ABNORMAL HIGH (ref 70–99)
Potassium: 4.9 mmol/L (ref 3.5–5.1)
Sodium: 132 mmol/L — ABNORMAL LOW (ref 135–145)
Total Bilirubin: 5.6 mg/dL — ABNORMAL HIGH (ref 0.3–1.2)
Total Protein: 7.8 g/dL (ref 6.5–8.1)

## 2022-07-15 LAB — CBC
HCT: 39.9 % (ref 39.0–52.0)
Hemoglobin: 13.3 g/dL (ref 13.0–17.0)
MCH: 36 pg — ABNORMAL HIGH (ref 26.0–34.0)
MCHC: 33.3 g/dL (ref 30.0–36.0)
MCV: 108.1 fL — ABNORMAL HIGH (ref 80.0–100.0)
Platelets: 74 10*3/uL — ABNORMAL LOW (ref 150–400)
RBC: 3.69 MIL/uL — ABNORMAL LOW (ref 4.22–5.81)
RDW: 17.8 % — ABNORMAL HIGH (ref 11.5–15.5)
WBC: 3.9 10*3/uL — ABNORMAL LOW (ref 4.0–10.5)
nRBC: 0 % (ref 0.0–0.2)

## 2022-07-15 LAB — MAGNESIUM: Magnesium: 1.4 mg/dL — ABNORMAL LOW (ref 1.7–2.4)

## 2022-07-15 MED ORDER — MAGNESIUM SULFATE 2 GM/50ML IV SOLN
2.0000 g | Freq: Once | INTRAVENOUS | Status: AC
  Administered 2022-07-15: 2 g via INTRAVENOUS
  Filled 2022-07-15: qty 50

## 2022-07-15 MED ORDER — POLYETHYLENE GLYCOL 3350 17 G PO PACK
17.0000 g | PACK | Freq: Every day | ORAL | Status: DC
  Administered 2022-07-15 – 2022-07-16 (×2): 17 g via ORAL
  Filled 2022-07-15 (×2): qty 1

## 2022-07-15 NOTE — NC FL2 (Signed)
[place] LEVEL OF CARE SCREENING TOOL     IDENTIFICATION  Patient Name: Kyle Hammond Birthdate: [birth year] Sex: male Admission Date (Current Location): 07/09/2022  [facility] and [place] Number:  [employer] and Address:  [place] [address], [place]      Provider Number: [id number]  Attending Physician Name and Address:  Rodena Goldmann, DO  Relative Name and Phone Number:  Quasim, Doyon (Daughter)   [phone]    Current Level of Care:   Recommended Level of Care: [place] Prior Approval Number:    Date Approved/Denied:   PASRR Number: [id number]  Discharge Plan: SNF    Current Diagnoses: Patient Active Problem List   Diagnosis Date Noted   Pleural effusion on right 07/10/2022   Liver failure ([place]) 78/93/8101   Alcoholic cirrhosis of liver without ascites ([place]) 02/04/2022   History of colonic polyps 01/06/2022   Thrombocytopenia ([place]) 12/10/2021   Hyperlipidemia 12/10/2021   Tobacco use disorder, continuous 09/10/2021   Tobacco use 11/29/2020   Mild dementia ([place]) 11/29/2020   Left inguinal hernia 11/29/2020   At risk for falls 06/01/2016   Senile osteoporosis 06/01/2016   Vitamin D deficiency 06/01/2016   Current smoker 06/01/2016   Alcohol dependence ([place]) 05/19/2016   Chronic hepatitis C without hepatic coma ([place]) 05/19/2016   Anemia 05/19/2016   Low magnesium level 05/19/2016   Closed fracture of left femur ([place]) 05/07/2016    Orientation RESPIRATION BLADDER Height & Weight     Self, Place  Normal Continent Weight: 60.8 kg Height:  '5\' 7"'$  (170.2 cm)  BEHAVIORAL SYMPTOMS/MOOD NEUROLOGICAL BOWEL NUTRITION STATUS      Continent Diet (See DC summary)  AMBULATORY STATUS COMMUNICATION OF NEEDS Skin   Extensive Assist Verbally Skin abrasions                       Personal Care Assistance Level of Assistance  Bathing, Dressing, Feeding Bathing Assistance: Maximum  assistance Feeding assistance: Limited assistance Dressing Assistance: Maximum assistance     Functional Limitations Info  Sight, Hearing, Speech Sight Info: Impaired Hearing Info: Impaired Speech Info: Adequate    SPECIAL CARE FACTORS FREQUENCY  PT (By licensed PT)     PT Frequency: 5 times a week              Contractures Contractures Info: Not present    Additional Factors Info  Code Status, Allergies Code Status Info: DNR Allergies Info: NKDA           Current Medications (07/15/2022):  This is the current hospital active medication list Current Facility-Administered Medications  Medication Dose Route Frequency Provider Last Rate Last Admin   (feeding supplement) PROSource Plus liquid 30 mL  30 mL Oral TID BM Kathie Dike, MD   30 mL at 07/15/22 1338   Chlorhexidine Gluconate Cloth 2 % PADS 6 each  6 each Topical Daily Kathie Dike, MD   6 each at 07/15/22 0843   feeding supplement (ENSURE ENLIVE / ENSURE PLUS) liquid 237 mL  237 mL Oral BID BM Kathie Dike, MD   237 mL at 75/10/25 8527   folic acid (FOLVITE) tablet 1 mg  1 mg Oral Daily Zierle-Ghosh, Asia B, DO   1 mg at 07/15/22 0905   ipratropium-albuterol (DUONEB) 0.5-2.5 (3) MG/3ML nebulizer solution 3 mL  3 mL Nebulization BID Kathie Dike, MD   3 mL at 07/15/22 0734   LORazepam (ATIVAN) tablet  1-4 mg  1-4 mg Oral Q1H PRN Zierle-Ghosh, Asia B, DO   2 mg at 07/15/22 0456   Or   LORazepam (ATIVAN) injection 1-4 mg  1-4 mg Intravenous Q1H PRN Zierle-Ghosh, Asia B, DO   1 mg at 07/15/22 1601   multivitamin with minerals tablet 1 tablet  1 tablet Oral Daily Zierle-Ghosh, Asia B, DO   1 tablet at 07/15/22 0905   nicotine (NICODERM CQ - dosed in mg/24 hours) patch 21 mg  21 mg Transdermal Daily Kathie Dike, MD   21 mg at 07/15/22 0904   ondansetron (ZOFRAN) tablet 4 mg  4 mg Oral Q6H PRN Zierle-Ghosh, Asia B, DO       Or   ondansetron (ZOFRAN) injection 4 mg  4 mg Intravenous Q6H PRN Zierle-Ghosh,  Asia B, DO       oxyCODONE (Oxy IR/ROXICODONE) immediate release tablet 5 mg  5 mg Oral Q4H PRN Zierle-Ghosh, Asia B, DO       pantoprazole (PROTONIX) injection 40 mg  40 mg Intravenous Q12H Kathie Dike, MD   40 mg at 07/15/22 0904   polyethylene glycol (MIRALAX / GLYCOLAX) packet 17 g  17 g Oral Daily Manuella Ghazi, Pratik D, DO   17 g at 07/15/22 1149   thiamine (VITAMIN B1) tablet 100 mg  100 mg Oral Daily Zierle-Ghosh, Asia B, DO   100 mg at 07/14/22 0915   Or   thiamine (VITAMIN B1) injection 100 mg  100 mg Intravenous Daily Zierle-Ghosh, Asia B, DO   100 mg at 07/15/22 0904     Discharge Medications: Please see discharge summary for a list of discharge medications.  Relevant Imaging Results:  Relevant Lab Results:   Additional Information SS# [id number]  Boneta Lucks, RN

## 2022-07-15 NOTE — Plan of Care (Signed)
  Problem: Acute Rehab PT Goals(only PT should resolve) Goal: Pt Will Go Supine/Side To Sit Outcome: Progressing Flowsheets (Taken 07/15/2022 1537) Pt will go Supine/Side to Sit: with supervision Goal: Patient Will Transfer Sit To/From Stand Outcome: Progressing Flowsheets (Taken 07/15/2022 1537) Patient will transfer sit to/from stand: with min guard assist Goal: Pt Will Transfer Bed To Chair/Chair To Bed Outcome: Progressing Flowsheets (Taken 07/15/2022 1537) Pt will Transfer Bed to Chair/Chair to Bed:  min guard assist  with min assist Goal: Pt Will Ambulate Outcome: Progressing Flowsheets (Taken 07/15/2022 1537) Pt will Ambulate:  25 feet  with minimal assist  with moderate assist  with rolling walker   3:38 PM, 07/15/22 Lonell Grandchild, MPT Physical Therapist with [facility] [phone] office [phone] mobile phone

## 2022-07-15 NOTE — Progress Notes (Signed)
Palliative: Mr. Deval, Mroczka, is lying quietly in bed.  He appears acutely/chronically ill and frail.  He is resting comfortably, but wakes when I touch his arm and call his name.  He is oriented to person, place, situation.  I believe that he can make his basic needs known.  His sister, Katharine Look, is present at bedside.  We talk about his acute health problems.  I encouraged Katharine Look to assist Khyson with eating and drinking.  Also encouraged her to push him to move at the top of every hour.  At this point Mr. Yett is agreeable to short-term rehab if qualified.  He tells me that he would prefer to return home if possible.  Would benefit from PT eval.  Chat from bedside nursing who shares that daughter, Felis Quillin informed a lot of wife Vickii Chafe are present at bedside.  Returned to room to find family present.  Mr. Low continues to wake when I touch him arm and call his name.  He continues to show signs of cognitive improvement, but remains very weak and frail.  He will frequently return to sleep during conversations.  Family and I talk about short-term rehab evaluation and recommendations, transition of care team working for bed location.  I encourage family to work closely with Education officer, museum at rehab facility for "what is next".  I encourage family to offer food and liquid at the top of every hour encouraging moving and coughing.  Conference with attending, bedside nursing staff, transition of care team related to patient condition, needs, goals of care, disposition.  Plan:  At this point continue to treat the treatable but no CPR or intubation.  PT eval.  Agreeable to short-term rehab if qualified.  24 minutes  Quinn Axe, NP Palliative medicine team Team phone [phone] Greater than 50% of this time was spent counseling and coordinating care related to the above assessment and plan.

## 2022-07-15 NOTE — TOC Progression Note (Signed)
Transition of Care [facility]) - Progression Note    Patient Details  Name: Kyle Hammond MRN: [id number] Date of Birth: 04-Apr-1956  Transition of Care [facility]) CM/SW Contact  Boneta Lucks, RN Phone Number: 07/15/2022, 4:25 PM  Clinical Narrative:   PT is recommending SNF. Patient is agreeable at this time. Wants PNC.  TOC completed PASRR and Sent out FL2 for bed offers.    Expected Discharge Plan: Skilled Nursing Facility Barriers to Discharge: Continued Medical Work up  Expected Discharge Plan and Services Expected Discharge Plan: [place] arrangements for the past 2 months: [place]

## 2022-07-15 NOTE — Progress Notes (Addendum)
   Subjective:  Patient is feeling well overall but stated he didn't sleep much last night because he was "hot". Patient's sister, Katharine Look, at bedside and states that she is encouraged by his improvements. No other acute events overnight.  Objective:  Vital signs in last 24 hours: Vitals:   07/15/22 0800 07/15/22 0843 07/15/22 0901 07/15/22 0903  BP: 107/84   108/84  Pulse:      Resp: 20  19   Temp:  98 F (36.7 C)    TempSrc:  Oral    SpO2:      Weight:      Height:       Weight change: -0.7 kg  Intake/Output Summary (Last 24 hours) at 07/15/2022 0912 Last data filed at 07/15/2022 0500 Gross per 24 hour  Intake --  Output 750 ml  Net -750 ml   Physical Examination  General: lying in bed on 2 L [place], in no acute distress Head: normocephalic, atraumatic Cardiovascular: regular rate and rhythm without murmurs, rubs, or gallops, no LEE Respiratory: normal respiratory effort, coarse breath sounds bilaterally Abdominal: normoactive bowel sounds, no tenderness to palpation Skin: warm, dry, no lesions Central Nervous System: alert and oriented x 3 Psychiatry: Normal mood and affect   Assessment/Plan:  Principal Problem:   Liver failure (HCC) Active Problems:   Alcohol dependence (HCC)   Chronic hepatitis C without hepatic coma (HCC)   Tobacco use disorder, continuous   Pleural effusion on right   Decompensated liver cirrhosis -History of ongoing alcohol use as well as hepatitis C -Discriminant function for alcoholic hepatitis is elevated, but after discussing with Dr. Gala Romney, he would not be a candidate for prednisolone due to untreated hep C -Reviewed with GI, Roseanne Kaufman NP who had seen patient in GI clinic. She was also in agreement that pursuing a palliative/hospice approach for this patient would be the most reasonable course of action and did not recommend further liver work up.   Anasarca/right pleural effusion -Suspect this is secondary to decompensated liver  cirrhosis. Patient's anasarca has improved but will likely be a chronic issue. -Albumin is 2.5   Chronic hepatitis C -Has followed up with GI in the past, but treatment was not pursued since he had ongoing alcohol use   Acute alcohol withdrawal with delirium tremens -Overall withdrawal appears to be improving as CIWA scores are trending down (2 today). Patient transitioned from scheduled to PRN Ativan. Patient continues to be tachycardic at 108 and blood pressures had been soft but improved this morning.   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 2g IV MgSO4 ordered. -Continue to monitor  Hypocalcemia Resolved      LOS: 6 days   Stanford Breed, Medical Student 07/15/2022, 9:12 AM

## 2022-07-15 NOTE — Evaluation (Signed)
Physical Therapy Evaluation Patient Details Name: Kyle Hammond MRN: [id number] DOB: 12/24/1955 Today's Date: 07/15/2022  History of Present Illness  Kyle Hammond is a 66 y.o. male with medical history significant of cirrhosis of liver, hepatitis C, alcohol use disorder, tobacco use disorder, presents to ED with chief complaint of dyspnea.  Patient went to urgent care about 1 week ago for cough.  At that point the cough had been already going on for several days.  He was diagnosed with pneumonia and prescribed doxycycline.  Daughter is unsure if patient was taking doxycycline, but thinks that he likely had not started it.  He followed up with the PCP yesterday who did labs and then told him to come into the ED due to his liver numbers.  Patient does have a history of hep C and and alcoholic cirrhosis.  He has not pursued treatment for alcoholic cirrhosis because he continues to drink.  Patient's daughter reports he will drink as much as he can afford to have that today.  His drink of choice is whiskey, but sometimes he can only afford beer.  She is not able to quantify how much is average for him.  She reports he has not been eating for an unknown amount of time.  She lives about 45 minutes from him and had not observed his eating habits until she saw him recently.  She reports he complains of nausea and has dry heaving regularly.  He has not been vomiting.  She reports that his cough is wet sounding, but nonproductive.  Patient reports he has had palpitations and chest pain.  They are worse when he is experiencing symptoms of orthopnea.  Getting up and walking outside helps his symptoms.  Daughter reports that she saw on her camera that he was outside every 30 minutes throughout the night trying to get a breath of fresh air.  He had peripheral edema and she is unsure how long its been going on.  His left leg is swollen more than his right.  He has a lot of pain in his left leg entirely, not just at the cath.   Daughter has nothing else to add to history at this time.  Patient reports he has no other complaints.  He is very hard of hearing and prefers daughter provide his history.  He came into the ED was found that his liver enzymes were high but improving from the day prior.  Since patient has been noncompliant with his doxycycline he was started on Rocephin, however his procalcitonin is undetectable so antibiotics would not be continued at this time.  Patient has no other complaints at this time.   Clinical Impression  Patient demonstrates slow labored movement for sitting up at bedside with frequent rest breaks, once standing very unsteady on feet with unstoppable trembling of extremities, unable to transfer to chair without AD, required use of RW due to fall risk and limited to a few side steps, steps forward/backwards before having to sit due to fatigue and generalized weakness.  Patient tolerated sitting up in chair after therapy - nursing staff notified.  Patient will benefit from continued skilled physical therapy in hospital and recommended venue below to increase strength, balance, endurance for safe ADLs and gait.          Recommendations for follow up therapy are one component of a multi-disciplinary discharge planning process, led by the attending physician.  Recommendations may be updated based on patient status, additional functional criteria and insurance  authorization.  Follow Up Recommendations Skilled nursing-short term rehab (<3 hours/day) Can patient physically be transported by private vehicle: No    Assistance Recommended at Discharge Intermittent Supervision/Assistance  Patient can return home with the following  A lot of help with bathing/dressing/bathroom;A lot of help with walking and/or transfers;Help with stairs or ramp for entrance;Assistance with cooking/housework    Equipment Recommendations None recommended by PT  Recommendations for Other Services       Functional  Status Assessment Patient has had a recent decline in their functional status and demonstrates the ability to make significant improvements in function in a reasonable and predictable amount of time.     Precautions / Restrictions Precautions Precautions: Fall Restrictions Weight Bearing Restrictions: No      Mobility  Bed Mobility Overal bed mobility: Needs Assistance Bed Mobility: Supine to Sit     Supine to sit: Min assist     General bed mobility comments: increased time, labored movement    Transfers Overall transfer level: Needs assistance Equipment used: Rolling walker (2 wheels), 1 person hand held assist Transfers: Sit to/from Stand, Bed to chair/wheelchair/BSC Sit to Stand: Min assist, Mod assist   Step pivot transfers: Mod assist       General transfer comment: very unsteady shaky movement and unable to transfers with bilateral hand held assist due to fall risk, required use of RW    Ambulation/Gait Ambulation/Gait assistance: Mod assist, Max assist Gait Distance (Feet): 8 Feet Assistive device: Rolling walker (2 wheels) Gait Pattern/deviations: Decreased step length - right, Decreased step length - left, Decreased stride length, Ataxic, Wide base of support Gait velocity: slow     General Gait Details: limited to a few slow labored ataxic like steps with wide base of support and trembling of extremities  Stairs            Wheelchair Mobility    Modified Rankin (Stroke Patients Only)       Balance Overall balance assessment: Needs assistance Sitting-balance support: Feet supported, No upper extremity supported Sitting balance-Leahy Scale: Fair Sitting balance - Comments: fair/good seated at EOB   Standing balance support: During functional activity, No upper extremity supported Standing balance-Leahy Scale: Poor Standing balance comment: fair/poor using RW                             Pertinent Vitals/Pain Pain  Assessment Pain Assessment: No/denies pain    Home Living Family/patient expects to be discharged to:: Private residence Living Arrangements: Alone Available Help at Discharge: Family;Available PRN/intermittently Type of Home: House Home Access: Stairs to enter     Alternate Level Stairs-Number of Steps: 22 Home Layout: Two level Home Equipment: Conservation officer, nature (2 wheels) Additional Comments: Patient possibly poor historian    Prior Function Prior Level of Function : Independent/Modified Independent             Mobility Comments: household and short distanced community ambulator without AD ADLs Comments: Independent for household, assisted by sister for picking up groceries and other community ADLs     Hand Dominance        Extremity/Trunk Assessment   Upper Extremity Assessment Upper Extremity Assessment: Generalized weakness    Lower Extremity Assessment Lower Extremity Assessment: Generalized weakness    Cervical / Trunk Assessment Cervical / Trunk Assessment: Normal  Communication   Communication: No difficulties  Cognition Arousal/Alertness: Awake/alert, Lethargic Behavior During Therapy: WFL for tasks assessed/performed Overall Cognitive Status: Within Functional Limits  for tasks assessed                                 General Comments: slightly lethargic        General Comments      Exercises     Assessment/Plan    PT Assessment Patient needs continued PT services  PT Problem List Decreased strength;Decreased activity tolerance;Decreased balance;Decreased mobility       PT Treatment Interventions DME instruction;Gait training;Stair training;Functional mobility training;Therapeutic activities;Therapeutic exercise;Patient/family education;Balance training    PT Goals (Current goals can be found in the Care Plan section)  Acute Rehab PT Goals Patient Stated Goal: return home with family to a assist PT Goal Formulation: With  patient Time For Goal Achievement: 07/29/22 Potential to Achieve Goals: Good    Frequency Min 3X/week     Co-evaluation               AM-PAC PT "6 Clicks" Mobility  Outcome Measure Help needed turning from your back to your side while in a flat bed without using bedrails?: A Little Help needed moving from lying on your back to sitting on the side of a flat bed without using bedrails?: A Little Help needed moving to and from a bed to a chair (including a wheelchair)?: A Lot Help needed standing up from a chair using your arms (e.g., wheelchair or bedside chair)?: A Lot Help needed to walk in hospital room?: A Lot Help needed climbing 3-5 steps with a railing? : A Lot 6 Click Score: 14    End of Session   Activity Tolerance: Patient tolerated treatment well;Patient limited by fatigue Patient left: in chair;with call bell/phone within reach Nurse Communication: Mobility status PT Visit Diagnosis: Unsteadiness on feet (R26.81);Other abnormalities of gait and mobility (R26.89);Muscle weakness (generalized) (M62.81)    Time: 7867-5449 PT Time Calculation (min) (ACUTE ONLY): 30 min   Charges:   PT Evaluation $PT Eval Moderate Complexity: 1 Mod PT Treatments $Therapeutic Activity: 23-37 mins        3:36 PM, 07/15/22 Lonell Grandchild, MPT Physical Therapist with [facility] [phone] office [phone] mobile phone

## 2022-07-16 DIAGNOSIS — R600 Localized edema: Secondary | ICD-10-CM | POA: Diagnosis not present

## 2022-07-16 DIAGNOSIS — M439 Deforming dorsopathy, unspecified: Secondary | ICD-10-CM | POA: Diagnosis not present

## 2022-07-16 DIAGNOSIS — E871 Hypo-osmolality and hyponatremia: Secondary | ICD-10-CM | POA: Diagnosis not present

## 2022-07-16 DIAGNOSIS — D696 Thrombocytopenia, unspecified: Secondary | ICD-10-CM | POA: Diagnosis not present

## 2022-07-16 DIAGNOSIS — E559 Vitamin D deficiency, unspecified: Secondary | ICD-10-CM | POA: Diagnosis not present

## 2022-07-16 DIAGNOSIS — R790 Abnormal level of blood mineral: Secondary | ICD-10-CM | POA: Diagnosis not present

## 2022-07-16 DIAGNOSIS — R000 Tachycardia, unspecified: Secondary | ICD-10-CM | POA: Diagnosis not present

## 2022-07-16 DIAGNOSIS — Z86010 Personal history of colonic polyps: Secondary | ICD-10-CM | POA: Diagnosis not present

## 2022-07-16 DIAGNOSIS — S0993XA Unspecified injury of face, initial encounter: Secondary | ICD-10-CM | POA: Diagnosis present

## 2022-07-16 DIAGNOSIS — Z720 Tobacco use: Secondary | ICD-10-CM | POA: Diagnosis not present

## 2022-07-16 DIAGNOSIS — I5020 Unspecified systolic (congestive) heart failure: Secondary | ICD-10-CM | POA: Diagnosis not present

## 2022-07-16 DIAGNOSIS — Z515 Encounter for palliative care: Secondary | ICD-10-CM | POA: Diagnosis not present

## 2022-07-16 DIAGNOSIS — K7460 Unspecified cirrhosis of liver: Secondary | ICD-10-CM | POA: Diagnosis not present

## 2022-07-16 DIAGNOSIS — R262 Difficulty in walking, not elsewhere classified: Secondary | ICD-10-CM | POA: Diagnosis not present

## 2022-07-16 DIAGNOSIS — M810 Age-related osteoporosis without current pathological fracture: Secondary | ICD-10-CM | POA: Diagnosis not present

## 2022-07-16 DIAGNOSIS — K739 Chronic hepatitis, unspecified: Secondary | ICD-10-CM | POA: Diagnosis not present

## 2022-07-16 DIAGNOSIS — S0083XA Contusion of other part of head, initial encounter: Secondary | ICD-10-CM | POA: Diagnosis not present

## 2022-07-16 DIAGNOSIS — F17209 Nicotine dependence, unspecified, with unspecified nicotine-induced disorders: Secondary | ICD-10-CM | POA: Diagnosis not present

## 2022-07-16 DIAGNOSIS — J810 Acute pulmonary edema: Secondary | ICD-10-CM | POA: Diagnosis not present

## 2022-07-16 DIAGNOSIS — R2689 Other abnormalities of gait and mobility: Secondary | ICD-10-CM | POA: Diagnosis not present

## 2022-07-16 DIAGNOSIS — W1839XA Other fall on same level, initial encounter: Secondary | ICD-10-CM | POA: Diagnosis not present

## 2022-07-16 DIAGNOSIS — K7210 Chronic hepatic failure without coma: Secondary | ICD-10-CM | POA: Diagnosis not present

## 2022-07-16 DIAGNOSIS — K7290 Hepatic failure, unspecified without coma: Secondary | ICD-10-CM | POA: Diagnosis not present

## 2022-07-16 DIAGNOSIS — E785 Hyperlipidemia, unspecified: Secondary | ICD-10-CM | POA: Diagnosis not present

## 2022-07-16 DIAGNOSIS — Z9181 History of falling: Secondary | ICD-10-CM | POA: Diagnosis not present

## 2022-07-16 DIAGNOSIS — Z7189 Other specified counseling: Secondary | ICD-10-CM | POA: Diagnosis not present

## 2022-07-16 DIAGNOSIS — S0990XA Unspecified injury of head, initial encounter: Secondary | ICD-10-CM | POA: Diagnosis not present

## 2022-07-16 DIAGNOSIS — K7031 Alcoholic cirrhosis of liver with ascites: Secondary | ICD-10-CM | POA: Diagnosis not present

## 2022-07-16 DIAGNOSIS — J90 Pleural effusion, not elsewhere classified: Secondary | ICD-10-CM | POA: Diagnosis not present

## 2022-07-16 DIAGNOSIS — D649 Anemia, unspecified: Secondary | ICD-10-CM | POA: Diagnosis not present

## 2022-07-16 DIAGNOSIS — Y92129 Unspecified place in nursing home as the place of occurrence of the external cause: Secondary | ICD-10-CM | POA: Diagnosis not present

## 2022-07-16 DIAGNOSIS — R5381 Other malaise: Secondary | ICD-10-CM | POA: Diagnosis not present

## 2022-07-16 DIAGNOSIS — W19XXXA Unspecified fall, initial encounter: Secondary | ICD-10-CM | POA: Diagnosis not present

## 2022-07-16 DIAGNOSIS — S0181XA Laceration without foreign body of other part of head, initial encounter: Secondary | ICD-10-CM | POA: Diagnosis not present

## 2022-07-16 DIAGNOSIS — R279 Unspecified lack of coordination: Secondary | ICD-10-CM | POA: Diagnosis not present

## 2022-07-16 DIAGNOSIS — F03A0 Unspecified dementia, mild, without behavioral disturbance, psychotic disturbance, mood disturbance, and anxiety: Secondary | ICD-10-CM | POA: Diagnosis not present

## 2022-07-16 DIAGNOSIS — G319 Degenerative disease of nervous system, unspecified: Secondary | ICD-10-CM | POA: Diagnosis not present

## 2022-07-16 DIAGNOSIS — K4090 Unilateral inguinal hernia, without obstruction or gangrene, not specified as recurrent: Secondary | ICD-10-CM | POA: Diagnosis not present

## 2022-07-16 DIAGNOSIS — Z743 Need for continuous supervision: Secondary | ICD-10-CM | POA: Diagnosis not present

## 2022-07-16 DIAGNOSIS — M6281 Muscle weakness (generalized): Secondary | ICD-10-CM | POA: Diagnosis not present

## 2022-07-16 LAB — CBC
HCT: 35.7 % — ABNORMAL LOW (ref 39.0–52.0)
HCT: 35.5 % — ABNORMAL LOW (ref 39.0–52.0)
Hemoglobin: 12 g/dL — ABNORMAL LOW (ref 13.0–17.0)
Hemoglobin: 12.1 g/dL — ABNORMAL LOW (ref 13.0–17.0)
MCH: 36.5 pg — ABNORMAL HIGH (ref 26.0–34.0)
MCH: 36.3 pg — ABNORMAL HIGH (ref 26.0–34.0)
MCHC: 33.6 g/dL (ref 30.0–36.0)
MCHC: 34.1 g/dL (ref 30.0–36.0)
MCV: 108.5 fL — ABNORMAL HIGH (ref 80.0–100.0)
MCV: 106.6 fL — ABNORMAL HIGH (ref 80.0–100.0)
Platelets: 77 10*3/uL — ABNORMAL LOW (ref 150–400)
Platelets: 59 10*3/uL — ABNORMAL LOW (ref 150–400)
RBC: 3.29 MIL/uL — ABNORMAL LOW (ref 4.22–5.81)
RBC: 3.33 MIL/uL — ABNORMAL LOW (ref 4.22–5.81)
RDW: 17.5 % — ABNORMAL HIGH (ref 11.5–15.5)
RDW: 17.1 % — ABNORMAL HIGH (ref 11.5–15.5)
WBC: 3.4 10*3/uL — ABNORMAL LOW (ref 4.0–10.5)
WBC: 3.2 10*3/uL — ABNORMAL LOW (ref 4.0–10.5)
nRBC: 0 % (ref 0.0–0.2)
nRBC: 0 % (ref 0.0–0.2)

## 2022-07-16 LAB — COMPREHENSIVE METABOLIC PANEL
ALT: 54 U/L — ABNORMAL HIGH (ref 0–44)
AST: 112 U/L — ABNORMAL HIGH (ref 15–41)
Albumin: 2.1 g/dL — ABNORMAL LOW (ref 3.5–5.0)
Alkaline Phosphatase: 50 U/L (ref 38–126)
Anion gap: 6 (ref 5–15)
BUN: 14 mg/dL (ref 8–23)
CO2: 21 mmol/L — ABNORMAL LOW (ref 22–32)
Calcium: 7.8 mg/dL — ABNORMAL LOW (ref 8.9–10.3)
Chloride: 104 mmol/L (ref 98–111)
Creatinine, Ser: 0.73 mg/dL (ref 0.61–1.24)
GFR, Estimated: >60 mL/min (ref >60)
Glucose, Bld: 87 mg/dL (ref 70–99)
Potassium: 3.8 mmol/L (ref 3.5–5.1)
Sodium: 131 mmol/L — ABNORMAL LOW (ref 135–145)
Total Bilirubin: 5.5 mg/dL — ABNORMAL HIGH (ref 0.3–1.2)
Total Protein: 6.7 g/dL (ref 6.5–8.1)

## 2022-07-16 LAB — MAGNESIUM: Magnesium: 1.3 mg/dL — ABNORMAL LOW (ref 1.7–2.4)

## 2022-07-16 MED ORDER — ADULT MULTIVITAMIN W/MINERALS CH: 1.0000 | ORAL_TABLET | Freq: Every day | ORAL | 0 refills | Status: AC

## 2022-07-16 MED ORDER — ENSURE ENLIVE PO LIQD: 237.0000 mL | Freq: Two times a day (BID) | ORAL | 12 refills | Status: AC

## 2022-07-16 MED ORDER — IPRATROPIUM-ALBUTEROL 0.5-2.5 (3) MG/3ML IN SOLN: 3.0000 mL | Freq: Four times a day (QID) | RESPIRATORY_TRACT | Status: DC | PRN

## 2022-07-16 MED ORDER — CHLORDIAZEPOXIDE HCL 10 MG PO CAPS: 10.0000 mg | ORAL_CAPSULE | Freq: Three times a day (TID) | ORAL | 0 refills | Status: DC | PRN

## 2022-07-16 MED ORDER — FOLIC ACID 1 MG PO TABS: 1.0000 mg | ORAL_TABLET | Freq: Every day | ORAL | 0 refills | Status: DC

## 2022-07-16 MED ORDER — MAGNESIUM SULFATE 4 GM/100ML IV SOLN
4.0000 g | Freq: Once | INTRAVENOUS | Status: AC
  Administered 2022-07-16: 4 g via INTRAVENOUS
  Filled 2022-07-16: qty 100

## 2022-07-16 MED ORDER — MAGNESIUM OXIDE 400 MG PO CAPS: 400.0000 mg | ORAL_CAPSULE | Freq: Every day | ORAL | 0 refills | Status: DC

## 2022-07-16 MED ORDER — VITAMIN B-1 100 MG PO TABS: 100.0000 mg | ORAL_TABLET | Freq: Every day | ORAL | 0 refills | Status: DC

## 2022-07-16 NOTE — Discharge Summary (Signed)
Physician Discharge Summary  JALENE LACKO HYI:[id number] DOB: 1956-05-11 DOA: 07/09/2022  PCP: Gwenlyn Perking, FNP  Admit date: 07/09/2022  Discharge date: 07/16/2022  Admitted From:Home  Disposition:  SNF  Recommendations for Outpatient Follow-up:  Follow up with PCP in 1-2 weeks Librium prescribed as needed for alcohol withdrawal symptoms, see prescription below Continue vitamin supplementation as prescribed below Continue meal supplementation as prescribed Continue magnesium supplementation as prescribed  Home Health: None  Equipment/Devices: None  Discharge Condition:Stable  CODE STATUS: DNR  Diet recommendation: Heart Healthy  Brief/Interim Summary:  66 year old male with a long history of alcohol abuse, hepatitis C, cirrhosis, tobacco use, admitted to the hospital with volume overload and right pleural effusion.  He was started on intravenous diuretics.  Echocardiogram performed showed EF of 20 to 25%.  Seen by cardiology who felt he was not a good candidate for aggressive measures and recommended palliative care conversations.  Hospital course complicated by development of worsening alcohol withdrawal.  He had undergone discussions with palliative care and he is currently a DNR but otherwise is to receive full scope of care.  Likely would benefit from outpatient palliative discussion in the near future as he will have continued severe alcohol abuse which will lead to recurrent hospitalizations and he is also not a candidate for liver transplant given his recurrent alcohol use.  He remained in the ICU for a period of several days requiring aggressive CIWA protocol with Ativan and has now overcome his withdrawal symptoms and is back to his baseline level of mentation.  He has been seen by PT with recommendation for SNF at this time and appears to be in stable condition for discharge.   Discharge Diagnoses:  Principal Problem:   Liver failure ([place]) Active Problems:    Alcohol dependence ([place])   Chronic hepatitis C without hepatic coma (HCC)   Tobacco use disorder, continuous   Pleural effusion on right  Principal discharge diagnosis: Decompensated liver cirrhosis with associated anasarca and right-sided pleural effusion along with acute alcohol withdrawal/DT.  Discharge Instructions  Discharge Instructions     Diet - low sodium heart healthy   Complete by: As directed    Increase activity slowly   Complete by: As directed       Allergies as of 07/16/2022   No Known Allergies      Medication List     STOP taking these medications    albuterol 108 (90 Base) MCG/ACT inhaler Commonly known as: VENTOLIN HFA   DOXYCYCLINE HYCLATE PO   furosemide 20 MG tablet Commonly known as: LASIX   predniSONE 10 MG tablet Commonly known as: DELTASONE       TAKE these medications    chlordiazePOXIDE 10 MG capsule Commonly known as: LIBRIUM Take 1 capsule (10 mg total) by mouth 3 (three) times daily as needed for anxiety or withdrawal.   feeding supplement Liqd Take 237 mLs by mouth 2 (two) times daily between meals.   folic acid 1 MG tablet Commonly known as: FOLVITE Take 1 tablet (1 mg total) by mouth daily. Start taking on: July 17, 2022   Magnesium Oxide 400 MG Caps Take 1 capsule (400 mg total) by mouth daily.   multivitamin with minerals Tabs tablet Take 1 tablet by mouth daily. Start taking on: July 17, 2022   thiamine 100 MG tablet Commonly known as: Vitamin B-1 Take 1 tablet (100 mg total) by mouth daily. Start taking on: July 17, 2022        Follow-up  Information     Gwenlyn Perking, FNP. Schedule an appointment as soon as possible for a visit in 1 week(s).   Specialty: Family Medicine Contact information: [place] [address] [phone]                No Known Allergies  Consultations: Cardiology Palliative care   Procedures/Studies: ECHOCARDIOGRAM COMPLETE  Result  Date: 07/10/2022    ECHOCARDIOGRAM REPORT   Patient Name:   Kyle Hammond Mayo Date of Exam: 07/10/2022 Medical Rec #:  [id number]     Height:       66.0 in Accession #:    [id number]    Weight:       127.0 lb Date of Birth:  [birth year]    BSA:          1.649 m Patient Age:    15 years      BP:           102/70 mmHg Patient Gender: M             HR:           104 bpm. Exam Location:  [facility] Procedure: 2D Echo, Cardiac Doppler and Color Doppler Indications:    Murmur R01.1  History:        Patient has no prior history of Echocardiogram examinations.                 Risk Factors:Current Smoker and Dyslipidemia. Alcohol dependence                 ([place]), Liver failure ([place]), Chronic hepatitis C without hepatic                 coma ([place]), Alcoholic cirrhosis of liver without ascites ([place]).  Sonographer:    Alvino Chapel RCS Referring Phys: [id number] ASIA B [place]  1. Left ventricular ejection fraction, by estimation, is 20 to 25%. The left ventricle has severely decreased function. The left ventricle demonstrates global hypokinesis. Left ventricular diastolic parameters are indeterminate.  2. Right ventricular systolic function is low normal. The right ventricular size is mildly enlarged. There is mildly elevated pulmonary artery systolic pressure.  3. Right atrial size was severely dilated.  4. The mitral valve is abnormal. Mild to moderate mitral valve regurgitation. No evidence of mitral stenosis.  5. The tricuspid valve is abnormal. Tricuspid valve regurgitation is moderate.  6. The aortic valve is tricuspid. Aortic valve regurgitation is not visualized. No aortic stenosis is present.  7. The inferior vena cava is dilated in size with <50% respiratory variability, suggesting right atrial pressure of 15 mmHg. FINDINGS  Left Ventricle: Left ventricular ejection fraction, by estimation, is 20 to 25%. The left ventricle has severely decreased function. The left ventricle demonstrates global hypokinesis.  The left ventricular internal cavity size was normal in size. There is no left ventricular hypertrophy. Left ventricular diastolic parameters are indeterminate. Right Ventricle: The right ventricular size is mildly enlarged. Right vetricular wall thickness was not well visualized. Right ventricular systolic function is low normal. There is mildly elevated pulmonary artery systolic pressure. The tricuspid regurgitant velocity is 2.69 m/s, and with an assumed right atrial pressure of 15 mmHg, the estimated right ventricular systolic pressure is 18.8 mmHg. Left Atrium: Left atrial size was normal in size. Right Atrium: Right atrial size was severely dilated. Pericardium: There is no evidence of pericardial effusion. Mitral Valve: The mitral valve is abnormal. There is mild thickening of the mitral  valve leaflet(s). There is mild calcification of the mitral valve leaflet(s). Mild mitral annular calcification. Mild to moderate mitral valve regurgitation. No evidence of mitral valve stenosis. Tricuspid Valve: The tricuspid valve is abnormal. Tricuspid valve regurgitation is moderate . No evidence of tricuspid stenosis. Aortic Valve: The aortic valve is tricuspid. Aortic valve regurgitation is not visualized. No aortic stenosis is present. Aortic valve mean gradient measures 2.8 mmHg. Aortic valve peak gradient measures 5.6 mmHg. Aortic valve area, by VTI measures 1.97 cm. Pulmonic Valve: The pulmonic valve was not well visualized. Pulmonic valve regurgitation is not visualized. No evidence of pulmonic stenosis. Aorta: The aortic root is normal in size and structure. Venous: The inferior vena cava is dilated in size with less than 50% respiratory variability, suggesting right atrial pressure of 15 mmHg. IAS/Shunts: No atrial level shunt detected by color flow Doppler.  LEFT VENTRICLE PLAX 2D LVIDd:         5.50 cm LVIDs:         4.80 cm LV PW:         1.10 cm LV IVS:        1.00 cm LVOT diam:     2.00 cm LV SV:         36  LV SV Index:   22 LVOT Area:     3.14 cm  RIGHT VENTRICLE TAPSE (M-mode): 1.8 cm LEFT ATRIUM             Index        RIGHT ATRIUM           Index LA diam:        4.30 cm 2.61 cm/m   RA Area:     22.60 cm LA Vol (A2C):   86.3 ml 52.34 ml/m  RA Volume:   80.90 ml  49.07 ml/m LA Vol (A4C):   98.7 ml 59.86 ml/m LA Biplane Vol: 92.9 ml 56.34 ml/m  AORTIC VALVE AV Area (Vmax):    2.08 cm AV Area (Vmean):   1.95 cm AV Area (VTI):     1.97 cm AV Vmax:           117.82 cm/s AV Vmean:          78.304 cm/s AV VTI:            0.182 m AV Peak Grad:      5.6 mmHg AV Mean Grad:      2.8 mmHg LVOT Vmax:         77.83 cm/s LVOT Vmean:        48.567 cm/s LVOT VTI:          0.114 m LVOT/AV VTI ratio: 0.63  AORTA Ao Root diam: 3.00 cm MITRAL VALVE                  TRICUSPID VALVE MV Area (PHT): 6.07 cm       TR Peak grad:   28.9 mmHg MV Decel Time: 125 msec       TR Vmax:        269.00 cm/s MR Peak grad:    63.7 mmHg MR Mean grad:    34.0 mmHg    SHUNTS MR Vmax:         399.00 cm/s  Systemic VTI:  0.11 m MR Vmean:        265.0 cm/s   Systemic Diam: 2.00 cm MR PISA:         1.01 cm MR PISA Eff ROA: 6  mm MR PISA Radius:  0.40 cm MV E velocity: 114.75 cm/s MV A velocity: 64.00 cm/s MV E/A ratio:  1.79 Carlyle Dolly MD Electronically signed by Carlyle Dolly MD Signature Date/Time: 07/10/2022/3:24:32 PM    Final    CT Chest W Contrast  Result Date: 07/09/2022 CLINICAL DATA:  Abnormal chest radiograph EXAM: CT CHEST WITH CONTRAST TECHNIQUE: Multidetector CT imaging of the chest was performed during intravenous contrast administration. RADIATION DOSE REDUCTION: This exam was performed according to the departmental dose-optimization program which includes automated exposure control, adjustment of the mA and/or kV according to patient size and/or use of iterative reconstruction technique. CONTRAST:  40m OMNIPAQUE IOHEXOL 300 MG/ML  SOLN COMPARISON:  Chest radiograph dated 07/09/2022, 07/04/2022, MR abdomen dated  02/27/2022 FINDINGS: Cardiovascular: Left atrial enlargement. Trace pericardial effusion. Great vessels are normal in course and caliber. No central pulmonary emboli. Coronary artery, aortic valvular, and mitral annular calcifications. Aortic atherosclerosis. Mediastinum/Nodes: Partially imaged thyroid gland without nodules meeting criteria for imaging follow-up by size. Normal esophagus. Precarinal lymph node measures 10 mm (2:62). Lungs/Pleura: The central airways are patent. There are layering secretions within the trachea. Mild bronchial wall thickening. Right middle lobe calcified granuloma. Right lower lobe relaxation atelectasis. No pneumothorax. Moderate right pleural effusion. Upper abdomen: Subcentimeter hepatic hypodensities, better evaluated on prior MR abdomen. Nodular hepatic contour. Ovoid calcification posterior to the right hepatic lobe may reflect sequela of prior inflammation. Punctate calcifications in the spleen, likely sequela of prior granulomatous infection. 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 Medullary nephrocalcinosis. Musculoskeletal: No acute or abnormal lytic or blastic osseous lesions. Anterior wedging of L1, similar to 07/04/2022. old sternal fracture deformity. IMPRESSION: 1. Moderate right pleural effusion with relaxation atelectasis of the right lower lobe. 2. Cirrhotic liver morphology. Subcentimeter hepatic hypodensities are better evaluated on prior MRI. 3. Medullary nephrocalcinosis. 4. Precarinal lymph node measuring 10 mm, likely reactive. 5. Aortic and coronary artery calcifications. Aortic Atherosclerosis (ICD10-I70.0). Electronically Signed   By: LDarrin NipperM.D.   On: 07/09/2022 19:39   DG Chest 2 View  Result Date: 07/09/2022 CLINICAL DATA:  Shortness of breath EXAM: CHEST - 2 VIEW COMPARISON:  Chest x-ray dated July 04, 2022 FINDINGS: Cardiomegaly. Mild interstitial opacities. Small right pleural effusion. No  evidence of pneumothorax. New moderate compression deformity of the lower thoracic/upper lumbar spine. IMPRESSION: 1. Mild interstitial opacities, likely due to pulmonary edema. 2. Small right pleural effusion. 3. New moderate compression deformity of the lower thoracic/upper lumbar spine. Correlate for point tenderness. Electronically Signed   By: LYetta GlassmanM.D.   On: 07/09/2022 17:01     Discharge Exam: Vitals:   07/16/22 0758 07/16/22 0924  BP:  98/72  Pulse:  100  Resp:  16  Temp:  97.6 F (36.4 C)  SpO2: 93% 100%   Vitals:   07/16/22 0500 07/16/22 0600 07/16/22 0758 07/16/22 0924  BP:  90/60  98/72  Pulse:  95  100  Resp:  20  16  Temp:  98.6 F (37 C)  97.6 F (36.4 C)  TempSrc:    Oral  SpO2:  97% 93% 100%  Weight: 58.9 kg     Height:        General: Pt is alert, awake, not in acute distress Cardiovascular: RRR, S1/S2 +, no rubs, no gallops Respiratory: CTA bilaterally, no wheezing, no rhonchi Abdominal: Soft, NT, ND, bowel sounds + Extremities: no edema, no cyanosis    The results of significant diagnostics from  this hospitalization (including imaging, microbiology, ancillary and laboratory) are listed below for reference.     Microbiology: Recent Results (from the past 240 hour(s))  MRSA Next Gen by PCR, Nasal     Status: None   Collection Time: 07/11/22  8:48 PM   Specimen: Nasal Mucosa; Nasal Swab  Result Value Ref Range Status   MRSA by PCR Next Gen NOT DETECTED NOT DETECTED Final    Comment: (NOTE) The GeneXpert MRSA Assay (FDA approved for NASAL specimens only), is one component of a comprehensive MRSA colonization surveillance program. It is not intended to diagnose MRSA infection nor to guide or monitor treatment for MRSA infections. Test performance is not FDA approved in patients less than 45 years old. Performed at [facility], [address] [place], [place] [address]      Labs: BNP (last 3 results) Recent Labs    07/11/22 0521   BNP 0,932.6*   Basic Metabolic Panel: Recent Labs  Lab 07/11/22 0521 07/12/22 1002 07/13/22 0412 07/14/22 1004 07/15/22 0310 07/16/22 0402  NA 131* 129* 132* 133* 132* 131*  K 4.3 3.6 5.3* 3.9 4.9 3.8  CL 100 97* 107 106 107 104  CO2 21* 24 19* 22 22 21*  GLUCOSE 90 122* 103* 123* 104* 87  BUN '19 14 13 13 13 14  '$ CREATININE 0.89 0.75 0.73 0.79 0.84 0.73  CALCIUM 7.5* 7.6* 7.6* 7.5* 7.8* 7.8*  MG 1.7 1.6* 2.1  --  1.4* 1.3*  PHOS 2.1* 2.3*  --   --   --   --    Liver Function Tests: Recent Labs  Lab 07/11/22 0521 07/12/22 1002 07/13/22 0412 07/14/22 1004 07/15/22 0310 07/16/22 0402  AST 164*  --  170* 133* 150* 112*  ALT 62*  --  65* 60* 66* 54*  ALKPHOS 42  --  49 47 53 50  BILITOT 5.6*  --  7.2* 6.0* 5.6* 5.5*  PROT 8.0  --  8.1 7.4 7.8 6.7  ALBUMIN 2.6* 2.6* 2.6* 2.4* 2.5* 2.1*   No results for input(s): "LIPASE", "AMYLASE" in the last 168 hours. Recent Labs  Lab 07/09/22 1710 07/10/22 0257  AMMONIA 28 43*   CBC: Recent Labs  Lab 07/09/22 1710 07/10/22 0257 07/11/22 0521 07/12/22 1002 07/13/22 0412 07/14/22 1004 07/15/22 0310 07/16/22 0402  WBC 2.2* 3.4*   < > 2.8* 3.8* 3.4* 3.9* 3.2*  NEUTROABS 1.6* 1.9  --   --   --   --   --   --   HGB 11.9* 10.9*   < > 12.4* 12.5* 12.0* 13.3 12.1*  HCT 33.3* 30.4*   < > 36.3* 36.4* 35.7* 39.9 35.5*  MCV 101.5* 99.7   < > 104.9* 104.6* 108.5* 108.1* 106.6*  PLT 93* 87*   < > 72* 87* 77* 74* 59*   < > = values in this interval not displayed.   Cardiac Enzymes: No results for input(s): "CKTOTAL", "CKMB", "CKMBINDEX", "TROPONINI" in the last 168 hours. BNP: Invalid input(s): "POCBNP" CBG: No results for input(s): "GLUCAP" in the last 168 hours. D-Dimer No results for input(s): "DDIMER" in the last 72 hours. Hgb A1c No results for input(s): "HGBA1C" in the last 72 hours. Lipid Profile No results for input(s): "CHOL", "HDL", "LDLCALC", "TRIG", "CHOLHDL", "LDLDIRECT" in the last 72 hours. Thyroid function  studies No results for input(s): "TSH", "T4TOTAL", "T3FREE", "THYROIDAB" in the last 72 hours.  Invalid input(s): "FREET3" Anemia work up No results for input(s): "VITAMINB12", "FOLATE", "FERRITIN", "TIBC", "IRON", "RETICCTPCT"  in the last 72 hours. Urinalysis    Component Value Date/Time   COLORURINE YELLOW 07/09/2022 1955   APPEARANCEUR CLEAR 07/09/2022 1955   LABSPEC 1.024 07/09/2022 1955   PHURINE 6.0 07/09/2022 1955   GLUCOSEU NEGATIVE 07/09/2022 1955   HGBUR NEGATIVE 07/09/2022 1955   BILIRUBINUR NEGATIVE 07/09/2022 1955   KETONESUR NEGATIVE 07/09/2022 1955   PROTEINUR NEGATIVE 07/09/2022 1955   NITRITE NEGATIVE 07/09/2022 1955   LEUKOCYTESUR TRACE (A) 07/09/2022 1955   Sepsis Labs Recent Labs  Lab 07/13/22 0412 07/14/22 1004 07/15/22 0310 07/16/22 0402  WBC 3.8* 3.4* 3.9* 3.2*   Microbiology Recent Results (from the past 240 hour(s))  MRSA Next Gen by PCR, Nasal     Status: None   Collection Time: 07/11/22  8:48 PM   Specimen: Nasal Mucosa; Nasal Swab  Result Value Ref Range Status   MRSA by PCR Next Gen NOT DETECTED NOT DETECTED Final    Comment: (NOTE) The GeneXpert MRSA Assay (FDA approved for NASAL specimens only), is one component of a comprehensive MRSA colonization surveillance program. It is not intended to diagnose MRSA infection nor to guide or monitor treatment for MRSA infections. Test performance is not FDA approved in patients less than 21 years old. Performed at [facility], [address], [place], [place] [address]      Time coordinating discharge: 35 minutes  SIGNED:   Rodena Goldmann, DO [facility] 07/16/2022, 10:06 AM  If 7PM-7AM, please contact night-coverage [email or web address]

## 2022-07-16 NOTE — TOC Transition Note (Signed)
Transition of Care [facility]) - CM/SW Discharge Note   Patient Details  Name: Kyle Hammond MRN: [id number] Date of Birth: 12-27-1955  [facility]) CM/SW Contact:  Boneta Lucks, RN Phone Number: 07/16/2022, 2:55 PM   Clinical Narrative:   Coolidge Breeze received, RN to call report. Patient ready to go to [facility]. TOC waiting to see if family can take patient. [facility] requested after 3PM admission.     Final next level of care: Skilled Nursing Facility Barriers to Discharge: Barriers Resolved   Patient Goals and CMS Choice Patient states their goals for this hospitalization and ongoing recovery are:: to go home. CMS Medicare.gov Compare Post Acute Care list provided to:: Patient Represenative (must comment) Choice offered to / list presented to : Adult Children  Discharge Placement      [facility]  Readmission Risk Interventions    07/16/2022   11:11 AM  Readmission Risk Prevention Plan  Transportation Screening Complete  PCP or Specialist Appt within 5-7 Days Not Complete  Home Care Screening Complete  Medication Review (RN CM) Complete

## 2022-07-16 NOTE — Progress Notes (Signed)
Patient has been stable this shift.  Patient does have brief periods of agitation, which coincide with his needing to use the restroom.  Allow patient to sit on bedside commode with redirection, and patient calms. Patient has had  bowel movement this am.  Patient vitals have been stable, has only required one dose of ativan this shift.

## 2022-07-16 NOTE — Care Management Important Message (Signed)
Important Message  Patient Details  Name: Kyle Hammond MRN: [id number] Date of Birth: [birth year]   Medicare Important Message Given:  Yes     Tommy Medal 07/16/2022, 3:00 PM

## 2022-07-16 NOTE — Progress Notes (Deleted)
   Subjective:  Patient is feeling well and slept much better last night. Per nursing, patient did have brief periods of agitation this morning which coincide with his needing to use the restroom. He calmed after using the bedside commode and with redirection. Patient only required one dose of ativan. Patient's sister, Katharine Look, at bedside.  Objective  Vital signs in last 24 hours: Vitals:   07/15/22 2032 07/16/22 0500 07/16/22 0600 07/16/22 0758  BP: 100/80  90/60   Pulse: (!) 107  95   Resp: (!) 22  20   Temp: 98.6 F (37 C)  98.6 F (37 C)   TempSrc: Oral     SpO2: 100%  97% 93%  Weight:  58.9 kg    Height:       Weight change: 3.7 kg  Intake/Output Summary (Last 24 hours) at 07/16/2022 7253 Last data filed at 07/16/2022 0845 Gross per 24 hour  Intake 169.97 ml  Output 601 ml  Net -431.03 ml   Physical Examination   General: lying in bed, in no acute distress Head: normocephalic, atraumatic Cardiovascular: regular rate and rhythm without murmurs, rubs, or gallops, no LEE Respiratory: normal respiratory effort, coarse breath sounds bilaterally Abdominal: normoactive bowel sounds, no tenderness to palpation Skin: warm, dry, no lesions Central Nervous System: alert and oriented x 3 Psychiatry: Normal mood and affect   Assessment/Plan:  Principal Problem:   Liver failure (HCC) Active Problems:   Alcohol dependence (HCC)   Chronic hepatitis C without hepatic coma (HCC)   Tobacco use disorder, continuous   Pleural effusion on right  Decompensated liver cirrhosis -History of ongoing alcohol use as well as hepatitis C -Discriminant function for alcoholic hepatitis is elevated, but after discussing with Dr. Gala Romney, he would not be a candidate for prednisolone due to untreated hep C -Reviewed with GI, Roseanne Kaufman NP who had seen patient in GI clinic. She was also in agreement that pursuing a palliative/hospice approach for this patient would be the most reasonable course of  action and did not recommend further liver work up.   Anasarca/right pleural effusion -Suspect this is secondary to decompensated liver cirrhosis. Patient's anasarca has improved but will likely be a chronic issue. -Albumin is 2.1   Chronic hepatitis C -Has followed up with GI in the past, but treatment was not pursued since he had ongoing alcohol use   Acute alcohol withdrawal with delirium tremens -Overall withdrawal appears to be improving. Patient is much more alert today. Patient transitioned from scheduled to PRN Ativan and only required one dose last night. Vitals are stable but blood pressures continue to be on softer side.  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 Patient received 2g IV MgSO4 yesterday. Ordering 4 g today. -Continue to monitor   Hypocalcemia Resolved  Social/Ethics PT evaluated patient yesterday and is recommending SNF. TOC working on bed offers but patient's history of alcohol abuse impedes disposition.  -Follow-up with TOC/SNF offers.    LOS: 7 days   Stanford Breed, Medical Student 07/16/2022, 8:52 AM

## 2022-07-16 NOTE — Progress Notes (Signed)
Palliative: Kyle Hammond, Kyle Hammond, is lying quietly in bed.  He is awake when I enter this morning.  He appears chronically ill and very frail.  He is oriented to person and situation, continuing to improve daily.  I am still not sure that he can make his basic needs known.  His Sister Kyle Hammond and older brother are present at bedside.  We talk about disposition to short-term rehab for starting therapy.  At this point [name] continues to share that he will participate in rehab.  I encouraged patient and family to frequently offer food and drink, and move as much as possible.  Plan: Continue to treat the treatable but no CPR or intubation.  DNR/[place] form completed and placed on chart.  Short-term rehab with ultimate goal to return to his own home.  39 minutes Kyle Axe, NP Palliative medicine team Team phone [phone] Greater than 50% of this time was spent counseling and coordinating care related to the above assessment and plan.

## 2022-07-16 NOTE — TOC Progression Note (Addendum)
Transition of Care [facility]) - Progression Note    Patient Details  Name: Kyle Hammond MRN: [id number] Date of Birth: [birth year]  Transition of Care [facility]) CM/SW Contact  Boneta Lucks, RN Phone Number: 07/16/2022, 11:23 AM  Clinical Narrative:   Daughter and sister accepting the bed offer at [facility], Sister at the bedside and patient is agreeable. INS Auth started, Debbie updated.    Addendum Josem Kaufmann received [id number]  ID# [id number]  Expected Discharge Plan: Skilled Nursing Facility Barriers to Discharge: Insurance Authorization  Expected Discharge Plan and Services Expected Discharge Plan: [place] arrangements for the past 2 months: Single Family Home Expected Discharge Date: 07/16/22                    Readmission Risk Interventions    07/16/2022   11:11 AM  Readmission Risk Prevention Plan  Transportation Screening Complete  PCP or Specialist Appt within 5-7 Days Not Complete  Home Care Screening Complete  Medication Review (RN CM) Complete

## 2022-07-17 LAB — CBC WITH DIFFERENTIAL/PLATELET
Basophils Absolute: 0.1 10*3/uL (ref 0.0–0.2)
Basos: 2 %
EOS (ABSOLUTE): 0 10*3/uL (ref 0.0–0.4)
Eos: 1 %
Hematocrit: 34.3 % — ABNORMAL LOW (ref 37.5–51.0)
Hemoglobin: 11.9 g/dL — ABNORMAL LOW (ref 13.0–17.7)
Immature Grans (Abs): 0 10*3/uL (ref 0.0–0.1)
Immature Granulocytes: 1 %
Lymphocytes Absolute: 1.3 10*3/uL (ref 0.7–3.1)
Lymphs: 40 %
MCH: 35.2 pg — ABNORMAL HIGH (ref 26.6–33.0)
MCHC: 34.7 g/dL (ref 31.5–35.7)
MCV: 102 fL — ABNORMAL HIGH (ref 79–97)
Monocytes Absolute: 0.3 10*3/uL (ref 0.1–0.9)
Monocytes: 8 %
NRBC: 1 % — ABNORMAL HIGH (ref 0–0)
Neutrophils Absolute: 1.5 10*3/uL (ref 1.4–7.0)
Neutrophils: 48 %
Platelets: 90 10*3/uL — CL (ref 150–450)
RBC: 3.38 x10E6/uL — ABNORMAL LOW (ref 4.14–5.80)
RDW: 12.9 % (ref 11.6–15.4)
WBC: 3.1 10*3/uL — ABNORMAL LOW (ref 3.4–10.8)

## 2022-07-17 LAB — MAGNESIUM: Magnesium: 0.7 mg/dL — CL (ref 1.6–2.3)

## 2022-07-17 LAB — CMP14+EGFR
ALT: 71 IU/L — ABNORMAL HIGH (ref 0–44)
AST: 226 IU/L — ABNORMAL HIGH (ref 0–40)
Albumin/Globulin Ratio: 0.7 — ABNORMAL LOW (ref 1.2–2.2)
Albumin: 3.2 g/dL — ABNORMAL LOW (ref 3.9–4.9)
Alkaline Phosphatase: 60 IU/L (ref 44–121)
BUN/Creatinine Ratio: 8 — ABNORMAL LOW (ref 10–24)
BUN: 8 mg/dL (ref 8–27)
Bilirubin Total: 6.7 mg/dL — ABNORMAL HIGH (ref 0.0–1.2)
CO2: 19 mmol/L — ABNORMAL LOW (ref 20–29)
Calcium: 7.5 mg/dL — ABNORMAL LOW (ref 8.6–10.2)
Chloride: 96 mmol/L (ref 96–106)
Creatinine, Ser: 0.96 mg/dL (ref 0.76–1.27)
Globulin, Total: 4.9 g/dL — ABNORMAL HIGH (ref 1.5–4.5)
Glucose: 80 mg/dL (ref 70–99)
Potassium: 3.6 mmol/L (ref 3.5–5.2)
Sodium: 132 mmol/L — ABNORMAL LOW (ref 134–144)
Total Protein: 8.1 g/dL (ref 6.0–8.5)
eGFR: 88 mL/min/{1.73_m2} (ref >59)

## 2022-07-17 LAB — FOLATE: Folate: 3.4 ng/mL (ref >3.0)

## 2022-07-17 LAB — VITAMIN B12: Vitamin B-12: 1076 pg/mL (ref 232–1245)

## 2022-07-17 LAB — VITAMIN B1: Thiamine: 62.8 nmol/L — ABNORMAL LOW (ref 66.5–200.0)

## 2022-07-19 ENCOUNTER — Emergency Department (HOSPITAL_COMMUNITY)
Admission: EM | Admit: 2022-07-19 | Discharge: 2022-07-19 | Disposition: A | Discharge status: Home / Self Care | Payer: Medicare Other | Attending: Emergency Medicine | Admitting: Emergency Medicine

## 2022-07-19 ENCOUNTER — Encounter (HOSPITAL_COMMUNITY): Payer: Self-pay | Admitting: *Deleted

## 2022-07-19 ENCOUNTER — Emergency Department (HOSPITAL_COMMUNITY): Payer: Medicare Other

## 2022-07-19 ENCOUNTER — Other Ambulatory Visit: Payer: Self-pay

## 2022-07-19 DIAGNOSIS — R279 Unspecified lack of coordination: Secondary | ICD-10-CM | POA: Diagnosis not present

## 2022-07-19 DIAGNOSIS — S0993XA Unspecified injury of face, initial encounter: Secondary | ICD-10-CM | POA: Diagnosis present

## 2022-07-19 DIAGNOSIS — S0083XA Contusion of other part of head, initial encounter: Secondary | ICD-10-CM | POA: Diagnosis not present

## 2022-07-19 DIAGNOSIS — R000 Tachycardia, unspecified: Secondary | ICD-10-CM | POA: Insufficient documentation

## 2022-07-19 DIAGNOSIS — R5381 Other malaise: Secondary | ICD-10-CM | POA: Diagnosis not present

## 2022-07-19 DIAGNOSIS — W1839XA Other fall on same level, initial encounter: Secondary | ICD-10-CM | POA: Diagnosis not present

## 2022-07-19 DIAGNOSIS — Y92129 Unspecified place in nursing home as the place of occurrence of the external cause: Secondary | ICD-10-CM | POA: Insufficient documentation

## 2022-07-19 DIAGNOSIS — W19XXXA Unspecified fall, initial encounter: Secondary | ICD-10-CM | POA: Diagnosis not present

## 2022-07-19 DIAGNOSIS — G319 Degenerative disease of nervous system, unspecified: Secondary | ICD-10-CM | POA: Diagnosis not present

## 2022-07-19 DIAGNOSIS — S0181XA Laceration without foreign body of other part of head, initial encounter: Secondary | ICD-10-CM | POA: Diagnosis not present

## 2022-07-19 DIAGNOSIS — Z743 Need for continuous supervision: Secondary | ICD-10-CM | POA: Diagnosis not present

## 2022-07-19 DIAGNOSIS — S0990XA Unspecified injury of head, initial encounter: Secondary | ICD-10-CM | POA: Diagnosis not present

## 2022-07-19 HISTORY — DX: Unspecified dementia, unspecified severity, without behavioral disturbance, psychotic disturbance, mood disturbance, and anxiety: F03.90

## 2022-07-19 HISTORY — DX: Hyperlipidemia, unspecified: E78.5

## 2022-07-19 HISTORY — DX: Thrombocytopenia, unspecified: D69.6

## 2022-07-19 HISTORY — DX: Anemia, unspecified: D64.9

## 2022-07-19 HISTORY — DX: Anxiety disorder, unspecified: F41.9

## 2022-07-19 HISTORY — DX: Vitamin D deficiency, unspecified: E55.9

## 2022-07-19 LAB — URINALYSIS, ROUTINE W REFLEX MICROSCOPIC
Glucose, UA: NEGATIVE mg/dL
Hgb urine dipstick: NEGATIVE
Ketones, ur: NEGATIVE mg/dL
Leukocytes,Ua: NEGATIVE
Nitrite: NEGATIVE
Protein, ur: NEGATIVE mg/dL
Specific Gravity, Urine: 1.017 (ref 1.005–1.030)
pH: 6 (ref 5.0–8.0)

## 2022-07-19 LAB — CBC WITH DIFFERENTIAL/PLATELET
Abs Immature Granulocytes: 0.01 10*3/uL (ref 0.00–0.07)
Basophils Absolute: 0 10*3/uL (ref 0.0–0.1)
Basophils Relative: 1 %
Eosinophils Absolute: 0 10*3/uL (ref 0.0–0.5)
Eosinophils Relative: 1 %
HCT: 34.4 % — ABNORMAL LOW (ref 39.0–52.0)
Hemoglobin: 12.2 g/dL — ABNORMAL LOW (ref 13.0–17.0)
Immature Granulocytes: 0 %
Lymphocytes Relative: 21 %
Lymphs Abs: 0.8 10*3/uL (ref 0.7–4.0)
MCH: 36.6 pg — ABNORMAL HIGH (ref 26.0–34.0)
MCHC: 35.5 g/dL (ref 30.0–36.0)
MCV: 103.3 fL — ABNORMAL HIGH (ref 80.0–100.0)
Monocytes Absolute: 0.4 10*3/uL (ref 0.1–1.0)
Monocytes Relative: 10 %
Neutro Abs: 2.5 10*3/uL (ref 1.7–7.7)
Neutrophils Relative %: 67 %
Platelets: 77 10*3/uL — ABNORMAL LOW (ref 150–400)
RBC: 3.33 MIL/uL — ABNORMAL LOW (ref 4.22–5.81)
RDW: 16.3 % — ABNORMAL HIGH (ref 11.5–15.5)
WBC: 3.7 10*3/uL — ABNORMAL LOW (ref 4.0–10.5)
nRBC: 0 % (ref 0.0–0.2)

## 2022-07-19 LAB — COMPREHENSIVE METABOLIC PANEL
ALT: 70 U/L — ABNORMAL HIGH (ref 0–44)
AST: 157 U/L — ABNORMAL HIGH (ref 15–41)
Albumin: 2.2 g/dL — ABNORMAL LOW (ref 3.5–5.0)
Alkaline Phosphatase: 58 U/L (ref 38–126)
Anion gap: 6 (ref 5–15)
BUN: 12 mg/dL (ref 8–23)
CO2: 25 mmol/L (ref 22–32)
Calcium: 7.6 mg/dL — ABNORMAL LOW (ref 8.9–10.3)
Chloride: 99 mmol/L (ref 98–111)
Creatinine, Ser: 0.75 mg/dL (ref 0.61–1.24)
GFR, Estimated: >60 mL/min (ref >60)
Glucose, Bld: 107 mg/dL — ABNORMAL HIGH (ref 70–99)
Potassium: 3.1 mmol/L — ABNORMAL LOW (ref 3.5–5.1)
Sodium: 130 mmol/L — ABNORMAL LOW (ref 135–145)
Total Bilirubin: 5.7 mg/dL — ABNORMAL HIGH (ref 0.3–1.2)
Total Protein: 6.9 g/dL (ref 6.5–8.1)

## 2022-07-19 MED ORDER — LIDOCAINE-EPINEPHRINE (PF) 2 %-1:200000 IJ SOLN
10.0000 mL | Freq: Once | INTRAMUSCULAR | Status: AC
  Administered 2022-07-19: 10 mL
  Filled 2022-07-19: qty 20

## 2022-07-19 NOTE — Discharge Instructions (Addendum)
Keep the wound clean with mild soap and water.  Keep it bandaged.  The sutures can come out in about 5 to 7 days.  Follow-up with his primary care provider for recheck.

## 2022-07-19 NOTE — ED Provider Notes (Signed)
[facility] EMERGENCY DEPARTMENT Provider Note   CSN: [id number] Arrival date & time: 07/19/22  1153     History  Chief Complaint  Patient presents with   Kyle Hammond    SALADIN PETRELLI is a 66 y.o. male.   Fall  Patient presents after fall.  Comes from nursing home.  Forehead laceration.  Reported week.  Not on blood thinners.  Without complaints per patient.  Patient's daughter later comes and states that he has been weak at the nursing home.  States there is not alarms on the beds and he can get up and fall.  States the patient pressed the button and no one comes to get him.  Reviewed notes of recent admission to the hospital.  Initial tachycardia and mildly low blood pressure.  We will get some basic blood work and evaluate.     Home Medications Prior to Admission medications   Medication Sig Start Date End Date Taking? Authorizing Provider  chlordiazePOXIDE (LIBRIUM) 10 MG capsule Take 1 capsule (10 mg total) by mouth 3 (three) times daily as needed for anxiety or withdrawal. 07/16/22   Manuella Ghazi, Pratik D, DO  feeding supplement (ENSURE ENLIVE / ENSURE PLUS) LIQD Take 237 mLs by mouth 2 (two) times daily between meals. 07/16/22   Manuella Ghazi, Pratik D, DO  folic acid (FOLVITE) 1 MG tablet Take 1 tablet (1 mg total) by mouth daily. 07/17/22 08/16/22  Manuella Ghazi, Pratik D, DO  Magnesium Oxide 400 MG CAPS Take 1 capsule (400 mg total) by mouth daily. 07/16/22 08/15/22  Heath Lark D, DO  Multiple Vitamin (MULTIVITAMIN WITH MINERALS) TABS tablet Take 1 tablet by mouth daily. 07/17/22 08/16/22  Manuella Ghazi, Pratik D, DO  thiamine (VITAMIN B-1) 100 MG tablet Take 1 tablet (100 mg total) by mouth daily. 07/17/22 08/16/22  Heath Lark D, DO      Allergies    Patient has no known allergies.    Review of Systems   Review of Systems  Physical Exam Updated Vital Signs BP 107/85 (BP Location: Left Arm)   Pulse (!) 107   Temp 97.9 F (36.6 C) (Oral)   Resp 18   Ht '5\' 7"'$  (1.702 m)   Wt 58.9 kg   SpO2 97%   BMI 20.34  kg/m  Physical Exam Vitals reviewed.  HENT:     Head:     Comments: Forehead laceration.  T shaped but approximately 1/2 cm total length. Cardiovascular:     Rate and Rhythm: Tachycardia present.  Abdominal:     Tenderness: There is no abdominal tenderness.  Musculoskeletal:        General: No tenderness.     Cervical back: No tenderness.  Skin:    General: Skin is warm.     Capillary Refill: Capillary refill takes less than 2 seconds.  Neurological:     Mental Status: Mental status is at baseline.     ED Results / Procedures / Treatments   Labs (all labs ordered are listed, but only abnormal results are displayed) Labs Reviewed  COMPREHENSIVE METABOLIC PANEL - Abnormal; Notable for the following components:      Result Value   Sodium 130 (*)    Potassium 3.1 (*)    Glucose, Bld 107 (*)    Calcium 7.6 (*)    Albumin 2.2 (*)    AST 157 (*)    ALT 70 (*)    Total Bilirubin 5.7 (*)    All other components within normal limits  CBC WITH DIFFERENTIAL/PLATELET -  Abnormal; Notable for the following components:   WBC 3.7 (*)    RBC 3.33 (*)    Hemoglobin 12.2 (*)    HCT 34.4 (*)    MCV 103.3 (*)    MCH 36.6 (*)    RDW 16.3 (*)    Platelets 77 (*)    All other components within normal limits  URINALYSIS, ROUTINE W REFLEX MICROSCOPIC - Abnormal; Notable for the following components:   Color, Urine AMBER (*)    Bilirubin Urine SMALL (*)    All other components within normal limits    EKG EKG Interpretation  Date/Time:  Sunday July 19 2022 14:38:46 EST Ventricular Rate:  105 PR Interval:  180 QRS Duration: 142 QT Interval:  398 QTC Calculation: 526 R Axis:   126 Text Interpretation: Sinus tachycardia with Premature atrial complexes Right axis deviation Left ventricular hypertrophy with QRS widening and repolarization abnormality ( Cornell product ) Cannot rule out Anterior infarct , age undetermined Abnormal ECG When compared with ECG of 10-Jul-2022 12:31, No  significant change since last tracing Confirmed by Davonna Belling [phone]) on 07/19/2022 3:45:14 PM  Radiology CT Head Wo Contrast  Result Date: 07/19/2022 CLINICAL DATA:  Provided history: Head trauma, minor. Fall. Wound to right aspect of forehead. EXAM: CT HEAD WITHOUT CONTRAST TECHNIQUE: Contiguous axial images were obtained from the base of the skull through the vertex without intravenous contrast. RADIATION DOSE REDUCTION: This exam was performed according to the departmental dose-optimization program which includes automated exposure control, adjustment of the mA and/or kV according to patient size and/or use of iterative reconstruction technique. COMPARISON:  Report from head CT 05/03/2016 (images unavailable). FINDINGS: Brain: Moderate to advanced generalized cerebral atrophy. Mild to moderate cerebellar atrophy. Mild-to-moderate patchy and ill-defined hypoattenuation within the cerebral white matter, nonspecific but compatible with chronic small vessel ischemic disease. There is no acute intracranial hemorrhage. No demarcated cortical infarct. No extra-axial fluid collection. No evidence of an intracranial mass. No midline shift. Vascular: No hyperdense vessel.  Atherosclerotic calcifications. Skull: No fracture or aggressive osseous lesion. Sinuses/Orbits: No mass or acute finding within the imaged orbits. Trace mucosal thickening within the bilateral frontal, ethmoid and left sphenoid sinuses at the imaged levels. Other: Right mastoid effusion. Left forehead, and possible laceration. IMPRESSION: 1. No evidence of acute intracranial abnormality. 2. Left forehead hematoma, and possible laceration. 3. Parenchymal atrophy and chronic small vessel ischemic disease, as described. 4. Right mastoid effusion. Electronically Signed   By: Kellie Simmering D.O.   On: 07/19/2022 15:49    Procedures Procedures    Medications Ordered in ED Medications  lidocaine-EPINEPHrine (XYLOCAINE W/EPI) 2 %-1:200000 (PF)  injection 10 mL (10 mLs Infiltration Given by Other 07/19/22 1533)    ED Course/ Medical Decision Making/ A&P                           Medical Decision Making Amount and/or Complexity of Data Reviewed Labs: ordered. Radiology: ordered.  Risk Prescription drug management.  Patient with fall.  Patient states his slipper just slipped and he fell.  Laceration to forehead.  We will closed.  We will get head imaging and some basic blood work since mild tachycardia.  Recent admission to hospital for cirrhosis.  Head CT reviewed and independently interpreted.  No laceration.  Blood work reassuring.  Do not see reason for admission to the hospital.  Lab work is similar to prior with some mild hyponatremia and thrombocytopenia.  We will closed  wound and will discharge home back to nursing home.        Final Clinical Impression(s) / ED Diagnoses Final diagnoses:  Fall, initial encounter  Laceration of forehead, initial encounter    Rx / DC Orders ED Discharge Orders     None         Davonna Belling, MD 07/19/22 1558

## 2022-07-19 NOTE — ED Provider Notes (Signed)
  I was asked by attending physician, Dr. Delane Ginger. Pick[place] to repair laceration to the left forehead.  This was my only involvement in patient's care.     Marland Kitchen.Laceration Repair  Date/Time: 07/19/2022 4:00 PM  Performed by: Kem Parkinson, PA-C Authorized by: Kem Parkinson, PA-C   Consent:    Consent obtained:  Verbal   Consent given by:  Patient   Risks discussed:  Poor wound healing and infection Universal protocol:    Test results available: yes     Imaging studies available: yes     Immediately prior to procedure, a time out was called: yes     Patient identity confirmed:  Verbally with patient and arm band Anesthesia:    Anesthesia method:  Local infiltration   Local anesthetic:  Lidocaine 2% WITH epi Laceration details:    Location:  Face   Face location:  Forehead   Length (cm):  2 Pre-procedure details:    Preparation:  Patient was prepped and draped in usual sterile fashion and imaging obtained to evaluate for foreign bodies Exploration:    Limited defect created (wound extended): no     Hemostasis achieved with:  Epinephrine   Imaging outcome: foreign body not noted     Wound exploration: wound explored through full range of motion and entire depth of wound visualized     Contaminated: no   Treatment:    Area cleansed with:  Povidone-iodine   Amount of cleaning:  Standard   Irrigation solution:  Sterile water   Debridement:  Minimal   Undermining:  None Skin repair:    Repair method:  Sutures   Suture size:  5-0   Suture material:  Prolene   Suture technique:  Simple interrupted   Number of sutures:  4 Approximation:    Approximation:  Close Repair type:    Repair type:  Simple Post-procedure details:    Dressing:  Non-adherent dressing and adhesive bandage   Procedure completion:  Tolerated well, no immediate complications       Kem Parkinson, PA-C 07/19/22 1649    Davonna Belling, MD 07/21/22 1445

## 2022-07-19 NOTE — ED Triage Notes (Signed)
Pt brought in by [facility] from [facility] with c/o fall this morning at 1100. Pt has wound to right side of forehead. Bleeding controlled. Skin tear to left forearm. No use of blood thinners.

## 2022-07-20 DIAGNOSIS — K739 Chronic hepatitis, unspecified: Secondary | ICD-10-CM | POA: Diagnosis not present

## 2022-07-20 DIAGNOSIS — K7460 Unspecified cirrhosis of liver: Secondary | ICD-10-CM | POA: Diagnosis not present

## 2022-07-20 DIAGNOSIS — M6281 Muscle weakness (generalized): Secondary | ICD-10-CM | POA: Diagnosis not present

## 2022-07-24 DIAGNOSIS — E871 Hypo-osmolality and hyponatremia: Secondary | ICD-10-CM | POA: Diagnosis not present

## 2022-07-24 DIAGNOSIS — K7460 Unspecified cirrhosis of liver: Secondary | ICD-10-CM | POA: Diagnosis not present

## 2022-07-24 DIAGNOSIS — J810 Acute pulmonary edema: Secondary | ICD-10-CM | POA: Diagnosis not present

## 2022-07-24 DIAGNOSIS — M439 Deforming dorsopathy, unspecified: Secondary | ICD-10-CM | POA: Diagnosis not present

## 2022-07-24 DIAGNOSIS — J90 Pleural effusion, not elsewhere classified: Secondary | ICD-10-CM | POA: Diagnosis not present

## 2022-07-24 DIAGNOSIS — Z720 Tobacco use: Secondary | ICD-10-CM | POA: Diagnosis not present

## 2022-07-28 DIAGNOSIS — R600 Localized edema: Secondary | ICD-10-CM | POA: Diagnosis not present

## 2022-07-28 DIAGNOSIS — K7460 Unspecified cirrhosis of liver: Secondary | ICD-10-CM | POA: Diagnosis not present

## 2022-07-28 DIAGNOSIS — I5020 Unspecified systolic (congestive) heart failure: Secondary | ICD-10-CM | POA: Diagnosis not present

## 2022-07-29 DIAGNOSIS — R600 Localized edema: Secondary | ICD-10-CM | POA: Diagnosis not present

## 2022-07-29 DIAGNOSIS — D696 Thrombocytopenia, unspecified: Secondary | ICD-10-CM | POA: Diagnosis not present

## 2022-07-31 DIAGNOSIS — K7290 Hepatic failure, unspecified without coma: Secondary | ICD-10-CM | POA: Diagnosis not present

## 2022-07-31 DIAGNOSIS — M6281 Muscle weakness (generalized): Secondary | ICD-10-CM | POA: Diagnosis not present

## 2022-07-31 DIAGNOSIS — K7460 Unspecified cirrhosis of liver: Secondary | ICD-10-CM | POA: Diagnosis not present

## 2022-07-31 DIAGNOSIS — K739 Chronic hepatitis, unspecified: Secondary | ICD-10-CM | POA: Diagnosis not present

## 2022-08-01 IMAGING — MR MR ABDOMEN WO/W CM
20 series · 48 of 48 positions shown · IV contrast (gadavist)
Comparison: None Available.

CLINICAL DATA: Hepatitis C, elevated AFP

EXAM:
MRI ABDOMEN WITHOUT AND WITH CONTRAST
TECHNIQUE: Multiplanar multisequence MR imaging of the abdomen was performed
both before and after the administration of intravenous contrast.
CONTRAST:  5mL GADAVIST GADOBUTROL 1 MMOL/ML IV SOLN

[Series 3: cor haste · coronal · 6.0mm · 1.25mm/px · 2 of 29 slices shown]
[im 1/29]
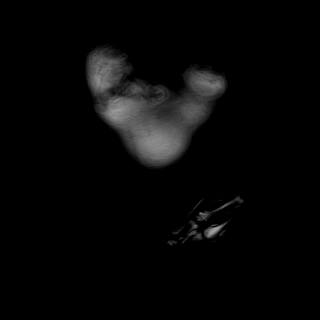
[im 29/29]
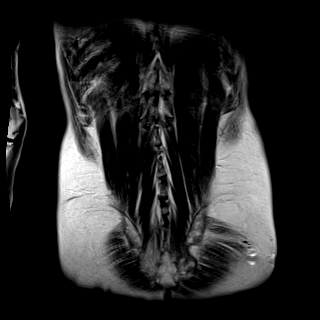

[Series 6: T2 fat-sat · axial · 6.0mm · 1.19mm/px · z∈[-88,+121]mm · 2 of 30 slices shown]
[im 1/30]
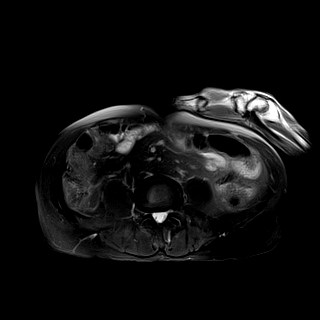
[im 30/30]
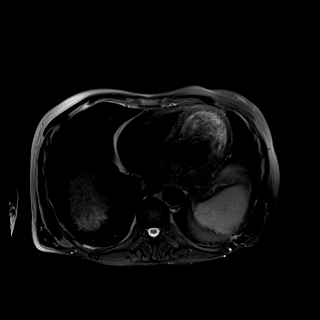

[Series 7: DWI · axial · 6.0mm · 1.42mm/px · 1 of 30 slices shown (1 of 4)]
[im 1/30]
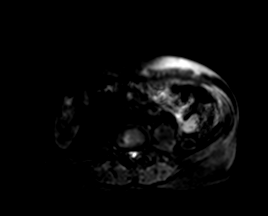

[Series 7: DWI · axial · 6.0mm · 1.42mm/px · 1 of 30 slices shown (2 of 4)]
[im 1/30]
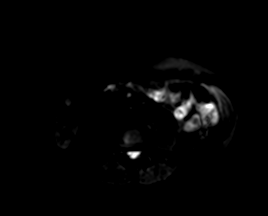

[Series 7: DWI · axial · 6.0mm · 1.42mm/px · 1 of 30 slices shown (3 of 4)]
[im 1/30]
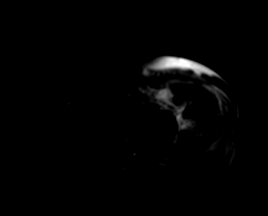

[Series 8: DWI · axial · 6.0mm · 1.42mm/px · 1 of 30 slices shown (4 of 4)]
[im 1/30]
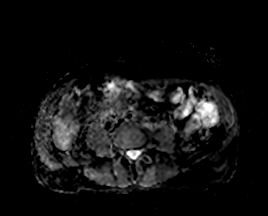

[Series 9: ax haste bh · axial · 6.0mm · 1.19mm/px · z∈[-128,+153]mm · 2 of 40 slices shown]
[im 1/40]
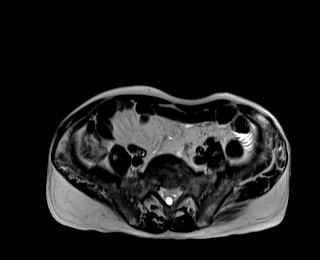
[im 40/40]
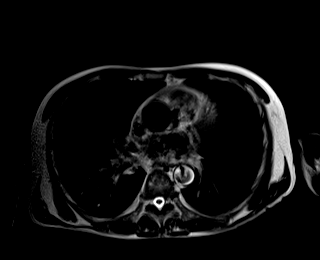

[Series 10: ax in and · axial · 3.0mm · 1.19mm/px · z∈[-82,+131]mm · 3 of 72 slices shown (1 of 2)]
[im 1/72]
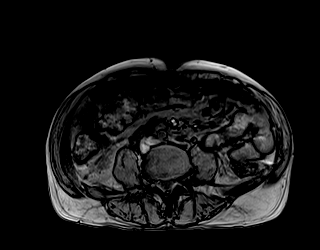
[im 36/72]
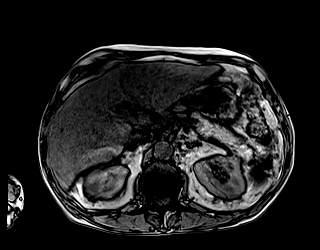
[im 72/72]
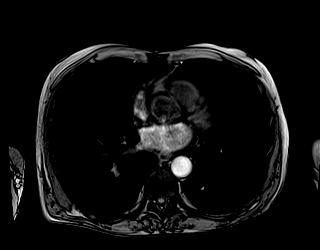

[Series 11: ax in and · axial · 3.0mm · 1.19mm/px · z∈[-82,+131]mm · 3 of 72 slices shown (2 of 2)]
[im 1/72]
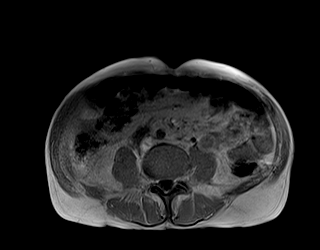
[im 36/72]
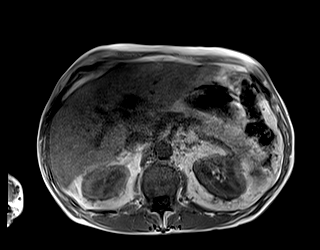
[im 72/72]
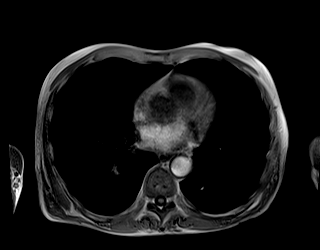

[Series 12: bSSFP · axial · 6.0mm · 0.74mm/px · z∈[-95,+139]mm · 2 of 40 slices shown]
[im 1/40]
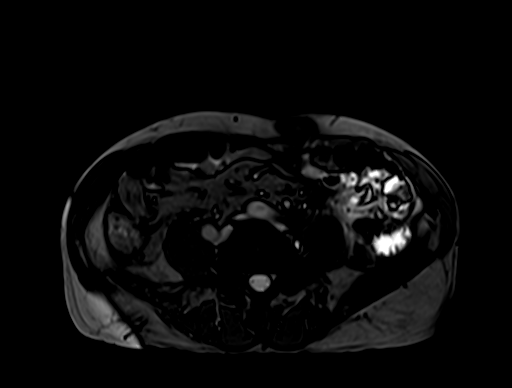
[im 40/40]
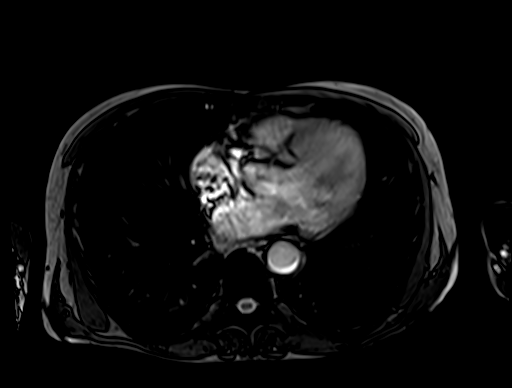

[Series 13: T1 dynamic · axial · 3.0mm · 1.19mm/px · z∈[-80,+133]mm · 3 of 72 slices shown]
[im 1/72]
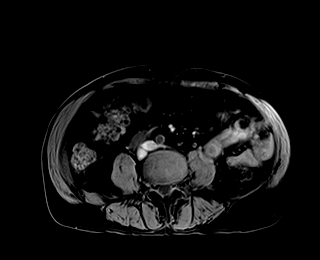
[im 36/72]
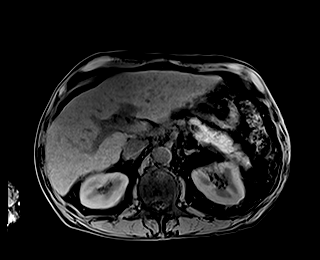
[im 72/72]
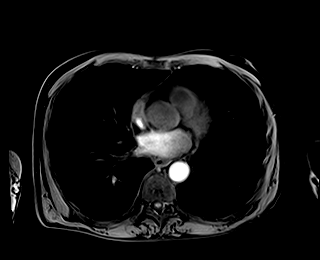

[Series 14: T1 dynamic post-contrast · axial · 3.0mm · 1.19mm/px · z∈[-80,+133]mm · 3 of 72 slices shown (1 of 9)]
[im 1/72]
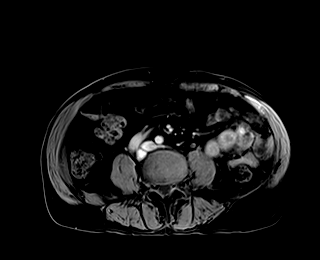
[im 36/72]
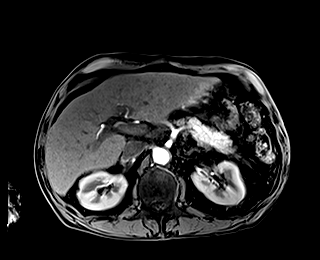
[im 72/72]
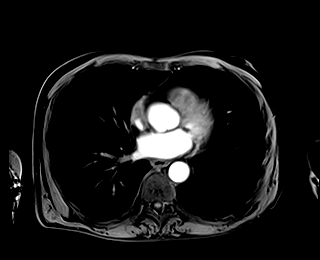

[Series 15: T1 dynamic post-contrast · axial · 3.0mm · 1.19mm/px · z∈[-80,+133]mm · 3 of 72 slices shown (2 of 9)]
[im 1/72]
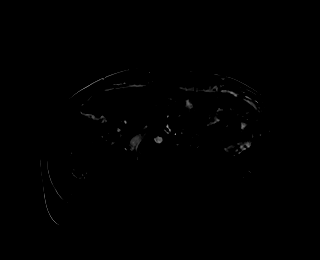
[im 36/72]
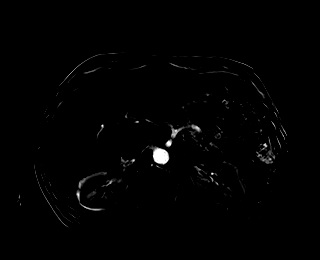
[im 72/72]
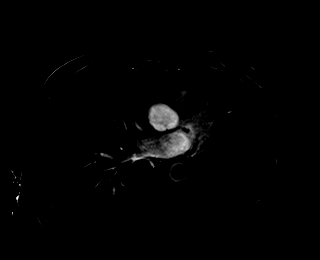

[Series 16: T1 dynamic post-contrast · axial · 3.0mm · 1.19mm/px · z∈[-80,+133]mm · 3 of 72 slices shown (3 of 9)]
[im 1/72]
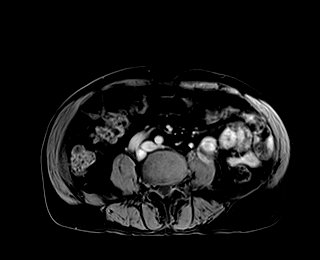
[im 36/72]
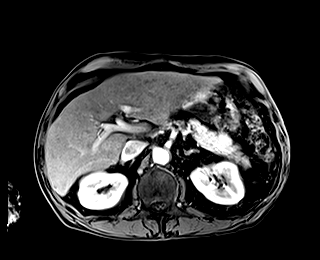
[im 72/72]
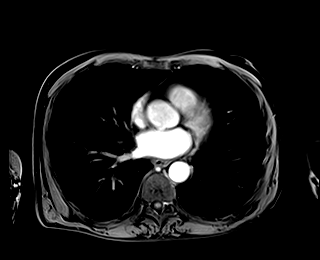

[Series 17: T1 dynamic post-contrast · axial · 3.0mm · 1.19mm/px · z∈[-80,+133]mm · 3 of 72 slices shown (4 of 9)]
[im 1/72]
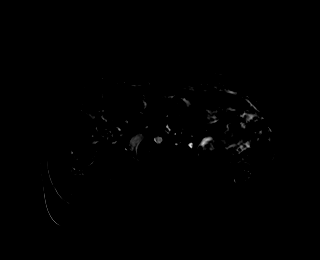
[im 36/72]
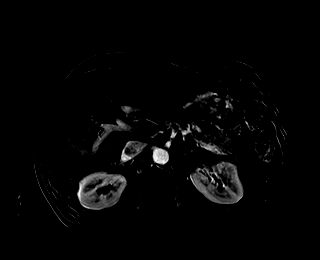
[im 72/72]
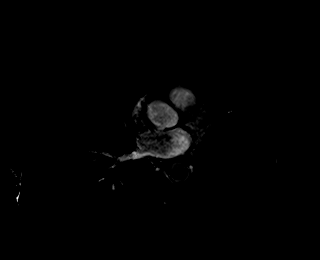

[Series 18: T1 dynamic post-contrast · axial · 3.0mm · 1.19mm/px · z∈[-80,+133]mm · 3 of 72 slices shown (5 of 9)]
[im 1/72]
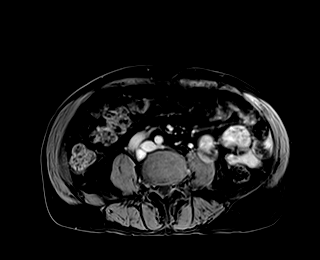
[im 36/72]
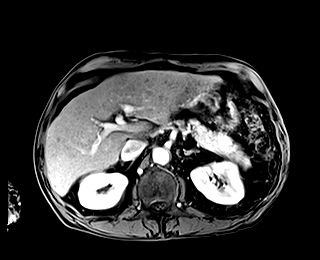
[im 72/72]
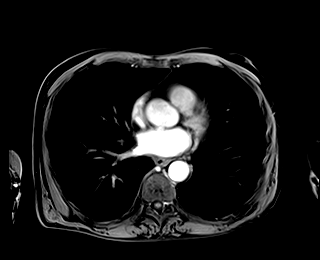

[Series 19: T1 dynamic post-contrast · axial · 3.0mm · 1.19mm/px · z∈[-80,+133]mm · 3 of 72 slices shown (6 of 9)]
[im 1/72]
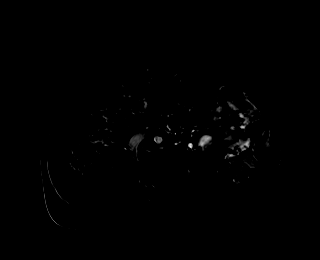
[im 36/72]
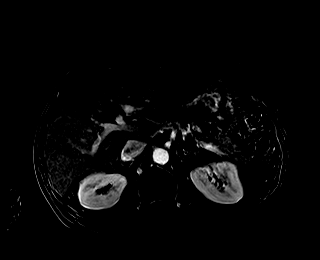
[im 72/72]
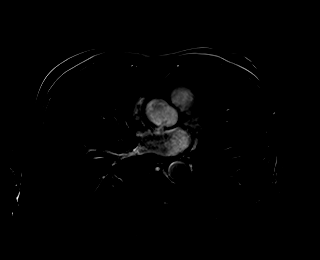

[Series 20: T1 dynamic post-contrast · coronal · 3.0mm · 1.31mm/px · 3 of 72 slices shown (7 of 9)]
[im 1/72]
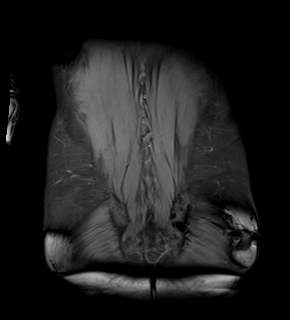
[im 36/72]
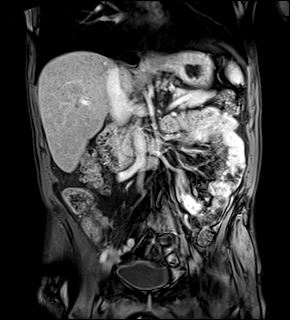
[im 72/72]
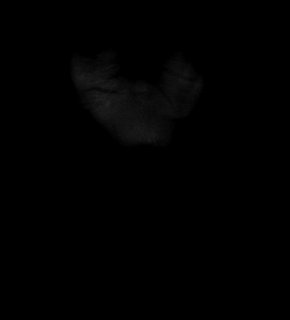

[Series 21: T1 dynamic post-contrast · axial · 3.0mm · 1.19mm/px · z∈[-80,+133]mm · 3 of 72 slices shown (8 of 9)]
[im 1/72]
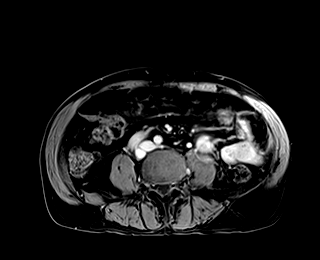
[im 36/72]
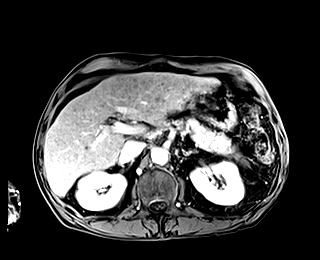
[im 72/72]
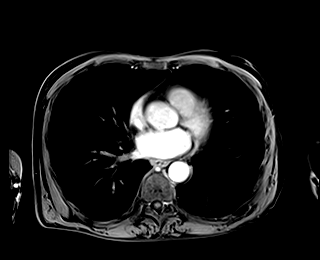

[Series 22: T1 dynamic post-contrast · axial · 3.0mm · 1.19mm/px · z∈[-80,+133]mm · 3 of 72 slices shown (9 of 9)]
[im 1/72]
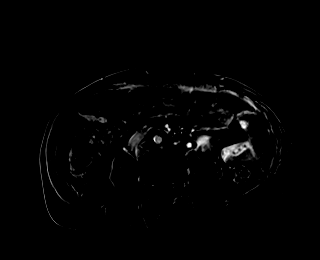
[im 36/72]
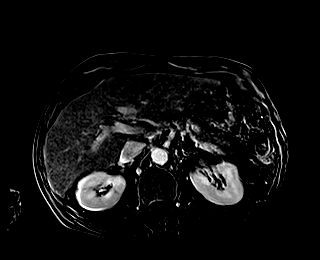
[im 72/72]
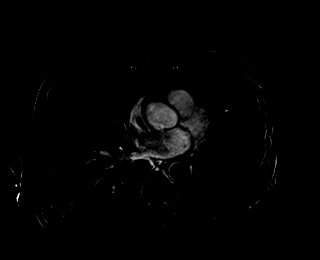

[48 of 48 positions shown; findings below may reference images not displayed]

FINDINGS: Lower chest: No acute findings.  Cardiomegaly.

Hepatobiliary: Coarse, nodular cirrhotic morphology of the liver.
Mild hepatic steatosis. Diffusely nodular, reticular enhancement of
the liver parenchyma. Multiple intrinsically T2 hypointense,
hypoenhancing lesions are scattered throughout the liver, the
largest in the right lobe of the liver, hepatic segment V/VI,
measuring 1.6 x 1.2 cm (series 21, image 30), an additional index
lesion of the anterior liver dome, hepatic segment VII measuring
x 0.8 cm (series 21, image 26). There are many additional similar
lesions, the majority subcentimeter. No mass or other parenchymal
abnormality identified. No gallstones. No biliary ductal dilatation.

Pancreas: No mass, inflammatory changes, or other parenchymal
abnormality identified.No pancreatic ductal dilatation.

Spleen:  Within normal limits in size and appearance.

Adrenals/Urinary Tract: Normal adrenal glands. No renal masses or
suspicious contrast enhancement identified. No evidence of
hydronephrosis.

Stomach/Bowel: Visualized portions within the abdomen are
unremarkable.

Vascular/Lymphatic: No pathologically enlarged lymph nodes
identified. No abdominal aortic aneurysm demonstrated. Aortic
atherosclerosis.

Other:  None.

Musculoskeletal: No suspicious osseous lesions identified.
IMPRESSION: 1. Cirrhosis and hepatic steatosis.
2. Multiple intrinsically T2 hypointense, hypoenhancing lesions are
scattered throughout the liver, the largest in the right lobe of the
liver, hepatic segment V/VI, measuring 1.6 x 1.2 cm, many additional
similar lesions, the majority subcentimeter. This may reflect
diffuse dysplastic or regenerative nodularity; as characterized by
PRUETT criteria these are category 3 lesions, intermediate
suspicion for hepatocellular carcinoma. Recommend follow-up
examination in 6 months to assess for stability.
3. No arterially hyperenhancing lesions.
4. Cardiomegaly.

## 2022-08-04 ENCOUNTER — Emergency Department (HOSPITAL_COMMUNITY): Payer: Medicare Other

## 2022-08-04 ENCOUNTER — Other Ambulatory Visit: Payer: Self-pay

## 2022-08-04 ENCOUNTER — Emergency Department (HOSPITAL_COMMUNITY)
Admission: EM | Admit: 2022-08-04 | Discharge: 2022-08-05 | Disposition: A | Discharge status: Home / Self Care | Payer: Medicare Other | Attending: Emergency Medicine | Admitting: Emergency Medicine

## 2022-08-04 DIAGNOSIS — G319 Degenerative disease of nervous system, unspecified: Secondary | ICD-10-CM | POA: Diagnosis not present

## 2022-08-04 DIAGNOSIS — J90 Pleural effusion, not elsewhere classified: Secondary | ICD-10-CM | POA: Diagnosis not present

## 2022-08-04 DIAGNOSIS — S0993XA Unspecified injury of face, initial encounter: Secondary | ICD-10-CM | POA: Diagnosis not present

## 2022-08-04 DIAGNOSIS — R531 Weakness: Secondary | ICD-10-CM | POA: Insufficient documentation

## 2022-08-04 DIAGNOSIS — D3501 Benign neoplasm of right adrenal gland: Secondary | ICD-10-CM | POA: Diagnosis not present

## 2022-08-04 DIAGNOSIS — W109XXA Fall (on) (from) unspecified stairs and steps, initial encounter: Secondary | ICD-10-CM | POA: Diagnosis not present

## 2022-08-04 DIAGNOSIS — R000 Tachycardia, unspecified: Secondary | ICD-10-CM | POA: Diagnosis not present

## 2022-08-04 DIAGNOSIS — E860 Dehydration: Secondary | ICD-10-CM | POA: Diagnosis not present

## 2022-08-04 DIAGNOSIS — K7031 Alcoholic cirrhosis of liver with ascites: Secondary | ICD-10-CM | POA: Insufficient documentation

## 2022-08-04 DIAGNOSIS — S0083XA Contusion of other part of head, initial encounter: Secondary | ICD-10-CM | POA: Diagnosis not present

## 2022-08-04 DIAGNOSIS — N200 Calculus of kidney: Secondary | ICD-10-CM | POA: Diagnosis not present

## 2022-08-04 DIAGNOSIS — K6389 Other specified diseases of intestine: Secondary | ICD-10-CM | POA: Diagnosis not present

## 2022-08-04 DIAGNOSIS — S199XXA Unspecified injury of neck, initial encounter: Secondary | ICD-10-CM | POA: Diagnosis not present

## 2022-08-04 DIAGNOSIS — K4020 Bilateral inguinal hernia, without obstruction or gangrene, not specified as recurrent: Secondary | ICD-10-CM | POA: Diagnosis not present

## 2022-08-04 DIAGNOSIS — R9082 White matter disease, unspecified: Secondary | ICD-10-CM | POA: Diagnosis not present

## 2022-08-04 DIAGNOSIS — S299XXA Unspecified injury of thorax, initial encounter: Secondary | ICD-10-CM | POA: Diagnosis not present

## 2022-08-04 DIAGNOSIS — K7460 Unspecified cirrhosis of liver: Secondary | ICD-10-CM | POA: Diagnosis not present

## 2022-08-04 DIAGNOSIS — I700 Atherosclerosis of aorta: Secondary | ICD-10-CM | POA: Diagnosis not present

## 2022-08-04 DIAGNOSIS — M2578 Osteophyte, vertebrae: Secondary | ICD-10-CM | POA: Diagnosis not present

## 2022-08-04 DIAGNOSIS — J811 Chronic pulmonary edema: Secondary | ICD-10-CM | POA: Diagnosis not present

## 2022-08-04 DIAGNOSIS — R918 Other nonspecific abnormal finding of lung field: Secondary | ICD-10-CM | POA: Diagnosis not present

## 2022-08-04 DIAGNOSIS — R600 Localized edema: Secondary | ICD-10-CM | POA: Insufficient documentation

## 2022-08-04 DIAGNOSIS — I2510 Atherosclerotic heart disease of native coronary artery without angina pectoris: Secondary | ICD-10-CM | POA: Diagnosis not present

## 2022-08-04 DIAGNOSIS — S3991XA Unspecified injury of abdomen, initial encounter: Secondary | ICD-10-CM | POA: Diagnosis not present

## 2022-08-04 LAB — CBC WITH DIFFERENTIAL/PLATELET
Abs Immature Granulocytes: 0.01 10*3/uL (ref 0.00–0.07)
Basophils Absolute: 0.1 10*3/uL (ref 0.0–0.1)
Basophils Relative: 1 %
Eosinophils Absolute: 0.1 10*3/uL (ref 0.0–0.5)
Eosinophils Relative: 3 %
HCT: 38.9 % — ABNORMAL LOW (ref 39.0–52.0)
Hemoglobin: 13.4 g/dL (ref 13.0–17.0)
Immature Granulocytes: 0 %
Lymphocytes Relative: 37 %
Lymphs Abs: 1.5 10*3/uL (ref 0.7–4.0)
MCH: 35.6 pg — ABNORMAL HIGH (ref 26.0–34.0)
MCHC: 34.4 g/dL (ref 30.0–36.0)
MCV: 103.5 fL — ABNORMAL HIGH (ref 80.0–100.0)
Monocytes Absolute: 0.4 10*3/uL (ref 0.1–1.0)
Monocytes Relative: 10 %
Neutro Abs: 2.1 10*3/uL (ref 1.7–7.7)
Neutrophils Relative %: 49 %
Platelets: 121 10*3/uL — ABNORMAL LOW (ref 150–400)
RBC: 3.76 MIL/uL — ABNORMAL LOW (ref 4.22–5.81)
RDW: 14.9 % (ref 11.5–15.5)
Smear Review: NORMAL
WBC: 4.2 10*3/uL (ref 4.0–10.5)
nRBC: 0.5 % — ABNORMAL HIGH (ref 0.0–0.2)

## 2022-08-04 LAB — COMPREHENSIVE METABOLIC PANEL
ALT: 50 U/L — ABNORMAL HIGH (ref 0–44)
AST: 118 U/L — ABNORMAL HIGH (ref 15–41)
Albumin: 2.4 g/dL — ABNORMAL LOW (ref 3.5–5.0)
Alkaline Phosphatase: 76 U/L (ref 38–126)
Anion gap: 9 (ref 5–15)
BUN: 8 mg/dL (ref 8–23)
CO2: 21 mmol/L — ABNORMAL LOW (ref 22–32)
Calcium: 7.7 mg/dL — ABNORMAL LOW (ref 8.9–10.3)
Chloride: 102 mmol/L (ref 98–111)
Creatinine, Ser: 0.63 mg/dL (ref 0.61–1.24)
GFR, Estimated: >60 mL/min (ref >60)
Glucose, Bld: 95 mg/dL (ref 70–99)
Potassium: 3.4 mmol/L — ABNORMAL LOW (ref 3.5–5.1)
Sodium: 132 mmol/L — ABNORMAL LOW (ref 135–145)
Total Bilirubin: 5 mg/dL — ABNORMAL HIGH (ref 0.3–1.2)
Total Protein: 7.9 g/dL (ref 6.5–8.1)

## 2022-08-04 LAB — AMMONIA: Ammonia: 45 umol/L — ABNORMAL HIGH (ref 9–35)

## 2022-08-04 LAB — LIPASE, BLOOD: Lipase: 65 U/L — ABNORMAL HIGH (ref 11–51)

## 2022-08-04 MED ORDER — IOHEXOL 300 MG/ML  SOLN
100.0000 mL | Freq: Once | INTRAMUSCULAR | Status: AC | PRN
  Administered 2022-08-04: 100 mL via INTRAVENOUS

## 2022-08-04 MED ORDER — SODIUM CHLORIDE 0.9 % IV SOLN: INTRAVENOUS | Status: DC

## 2022-08-04 MED ORDER — SODIUM CHLORIDE 0.9 % IV BOLUS
500.0000 mL | Freq: Once | INTRAVENOUS | Status: AC
  Administered 2022-08-04: 500 mL via INTRAVENOUS

## 2022-08-04 NOTE — ED Triage Notes (Addendum)
POV from home. Pt states he "hasn't been feeling right" for a couple weeks. Was d/c Friday from [place].  Is a DNR per daughter  Hx of cirrhosis

## 2022-08-04 NOTE — ED Provider Notes (Signed)
  [facility] EMERGENCY DEPARTMENT Provider Note   CSN: [id number] Arrival date & time: 08/04/22  2001     History {Add pertinent medical, surgical, social history, OB history to HPI:1} Chief Complaint  Patient presents with   Weakness    Kyle Hammond is a 66 y.o. male.  HPI     Home Medications Prior to Admission medications   Medication Sig Start Date End Date Taking? Authorizing Provider  chlordiazePOXIDE (LIBRIUM) 10 MG capsule Take 1 capsule (10 mg total) by mouth 3 (three) times daily as needed for anxiety or withdrawal. 07/16/22   Manuella Ghazi, Pratik D, DO  feeding supplement (ENSURE ENLIVE / ENSURE PLUS) LIQD Take 237 mLs by mouth 2 (two) times daily between meals. 07/16/22   Manuella Ghazi, Pratik D, DO  folic acid (FOLVITE) 1 MG tablet Take 1 tablet (1 mg total) by mouth daily. 07/17/22 08/16/22  Manuella Ghazi, Pratik D, DO  Magnesium Oxide 400 MG CAPS Take 1 capsule (400 mg total) by mouth daily. 07/16/22 08/15/22  Heath Lark D, DO  Multiple Vitamin (MULTIVITAMIN WITH MINERALS) TABS tablet Take 1 tablet by mouth daily. 07/17/22 08/16/22  Manuella Ghazi, Pratik D, DO  thiamine (VITAMIN B-1) 100 MG tablet Take 1 tablet (100 mg total) by mouth daily. 07/17/22 08/16/22  Heath Lark D, DO      Allergies    Patient has no known allergies.    Review of Systems   Review of Systems  Physical Exam Updated Vital Signs BP 103/87   Pulse (!) 156   Temp 97.7 F (36.5 C) (Oral)   Resp 16   Ht 1.702 m ('5\' 7"'$ )   Wt 59 kg   SpO2 97%   BMI 20.37 kg/m  Physical Exam  ED Results / Procedures / Treatments   Labs (all labs ordered are listed, but only abnormal results are displayed) Labs Reviewed - No data to display  EKG None  Radiology No results found.  Procedures Procedures  {Document cardiac monitor, telemetry assessment procedure when appropriate:1}  Medications Ordered in ED Medications - No data to display  ED Course/ Medical Decision Making/ A&P                           Medical Decision  Making  ***  {Document critical care time when appropriate:1} {Document review of labs and clinical decision tools ie heart score, Chads2Vasc2 etc:1}  {Document your independent review of radiology images, and any outside records:1} {Document your discussion with family members, caretakers, and with consultants:1} {Document social determinants of health affecting pt's care:1} {Document your decision making why or why not admission, treatments were needed:1} Final Clinical Impression(s) / ED Diagnoses Final diagnoses:  None    Rx / DC Orders ED Discharge Orders     None

## 2022-08-04 NOTE — ED Notes (Addendum)
Pt has redness and swelling to scrotum that he reports has been there x 3 weeks. Bilateral +3 lower extremity pitting edema present. Pt is alert and oriented to self and place only. Pt heart rate is 152 on cardiac monitor. Respirations equal and unlabored. Place on 2 L [place] for cardiac support. Kyle Hammond

## 2022-08-05 ENCOUNTER — Encounter: Payer: Self-pay | Admitting: Family Medicine

## 2022-08-05 ENCOUNTER — Ambulatory Visit (INDEPENDENT_AMBULATORY_CARE_PROVIDER_SITE_OTHER): Payer: Medicare Other | Admitting: Family Medicine

## 2022-08-05 VITALS — BP 84/64 | HR 104 | Temp 98.8°F | Ht 67.0 in | Wt 138.2 lb

## 2022-08-05 DIAGNOSIS — I9589 Other hypotension: Secondary | ICD-10-CM | POA: Diagnosis not present

## 2022-08-05 DIAGNOSIS — E861 Hypovolemia: Secondary | ICD-10-CM

## 2022-08-05 DIAGNOSIS — I509 Heart failure, unspecified: Secondary | ICD-10-CM | POA: Diagnosis not present

## 2022-08-05 DIAGNOSIS — G4701 Insomnia due to medical condition: Secondary | ICD-10-CM

## 2022-08-05 DIAGNOSIS — K7030 Alcoholic cirrhosis of liver without ascites: Secondary | ICD-10-CM | POA: Diagnosis not present

## 2022-08-05 DIAGNOSIS — B182 Chronic viral hepatitis C: Secondary | ICD-10-CM

## 2022-08-05 DIAGNOSIS — K7210 Chronic hepatic failure without coma: Secondary | ICD-10-CM

## 2022-08-05 DIAGNOSIS — E440 Moderate protein-calorie malnutrition: Secondary | ICD-10-CM | POA: Diagnosis not present

## 2022-08-05 LAB — URINALYSIS, ROUTINE W REFLEX MICROSCOPIC
Bacteria, UA: NONE SEEN
Bilirubin Urine: NEGATIVE
Glucose, UA: NEGATIVE mg/dL
Hgb urine dipstick: NEGATIVE
Ketones, ur: NEGATIVE mg/dL
Nitrite: NEGATIVE
Protein, ur: NEGATIVE mg/dL
Specific Gravity, Urine: >1.046 — ABNORMAL HIGH (ref 1.005–1.030)
pH: 7 (ref 5.0–8.0)

## 2022-08-05 MED ORDER — SODIUM CHLORIDE 0.9 % IV BOLUS
500.0000 mL | Freq: Once | INTRAVENOUS | Status: AC
  Administered 2022-08-05: 500 mL via INTRAVENOUS

## 2022-08-05 MED ORDER — FUROSEMIDE 20 MG PO TABS: 20.0000 mg | ORAL_TABLET | Freq: Every day | ORAL | 1 refills | Status: AC

## 2022-08-05 MED ORDER — VITAMIN B-1 100 MG PO TABS: 100.0000 mg | ORAL_TABLET | Freq: Every day | ORAL | 1 refills | Status: AC

## 2022-08-05 MED ORDER — FOLIC ACID 1 MG PO TABS: 1.0000 mg | ORAL_TABLET | Freq: Every day | ORAL | 0 refills | Status: AC

## 2022-08-05 MED ORDER — MAGNESIUM OXIDE 400 MG PO CAPS: 400.0000 mg | ORAL_CAPSULE | Freq: Every day | ORAL | 1 refills | Status: AC

## 2022-08-05 MED ORDER — TRAZODONE HCL 50 MG PO TABS: 25.0000 mg | ORAL_TABLET | Freq: Every evening | ORAL | 3 refills | Status: AC | PRN

## 2022-08-05 MED ORDER — SODIUM CHLORIDE 1 G PO TABS: 1.0000 g | ORAL_TABLET | Freq: Every day | ORAL | 1 refills | Status: AC

## 2022-08-05 NOTE — ED Notes (Signed)
Attempted to reach daughter , [name] x 2. Bryson Corona Edd Fabian

## 2022-08-05 NOTE — ED Provider Notes (Addendum)
  Provider Note MRN:  [id number]  Arrival date & time: 08/05/22    ED Course and Medical Decision Making  Assumed care from Dr. Rogene Houston at shift change.  History of cirrhosis arriving tachycardic, improving with fluids, suspect still dehydrated under resuscitated, will reassess after further fluids.  2:20 AM update: Patient feels well on my reassessment.  Has no complaints at this time.  Heart rate down to the 90s, blood pressure 627 systolic which seems to be his baseline based on prior records.  Abdomen is completely soft and nontender, does have some mild distention.  Regarding the CT read, I doubt patient is having an acute inflammatory colitis, suspect the findings are more related to ascites.  No indication for further testing or admission, patient is appropriate for discharge.  Procedures  Final Clinical Impressions(s) / ED Diagnoses     ICD-10-CM   1. Generalized weakness  R53.1     2. Dehydration  E86.0     3. 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  Your exam/testing today is overall reassuring.  Recommend close follow-up with your regular doctors.  Please return to the Emergency Department if you experience any worsening of your condition.   Thank you for allowing [place] to be a part of your care.      Barth Kirks. Sedonia Small, [place] [email or web address]    Maudie Flakes, MD 08/05/22 0350    Maudie Flakes, MD 08/05/22 [phone]

## 2022-08-05 NOTE — ED Notes (Signed)
Daughter Kyle Hammond took patient home. She is aware to keep follow up appt and to encourage fluids. Bryson Corona Edd Fabian

## 2022-08-05 NOTE — ED Notes (Signed)
This RN assisted pt with getting dressed. Sprite and applesauce provided while  he waits for his daughter to get her. Kyle Hammond

## 2022-08-05 NOTE — ED Notes (Signed)
Spoke with daughter and she reports she will work on picking patient up. Kyle Hammond

## 2022-08-05 NOTE — Discharge Instructions (Addendum)
You were evaluated in the Emergency Department and after careful evaluation, we did not find any emergent condition requiring admission or further testing in the hospital.  Your exam/testing today is overall reassuring.  Recommend close follow-up with your regular doctors.  Please return to the Emergency Department if you experience any worsening of your condition.   Thank you for allowing [place] to be a part of your care.

## 2022-08-05 NOTE — Progress Notes (Signed)
Established Patient Office Visit  Subjective   Patient ID: Kyle Hammond, male    DOB: 09-22-1955  Age: 66 y.o. MRN: [id number]  Chief Complaint  Patient presents with   Hospitalization Follow-up    HPI Kyle Hammond is here for a hospital follow up. He is here with his daughter and ex-wife today. He was admitted to [facility] on 07/09/22 and then discharged on 07/16/22 for liver failure and a right pleural effusion. He was placed on IV diuretics. An echo showed EF of 20-25%. Cardiology was consulted and recommended palliative approach as he is not a candidate for aggressive treatment. 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 He was discharged to a SNF per PT recommended. He was discharged on lasix 20 mg, folic acid, a multivitamin, and thiamine. He was then seen in the ER on 07/19/22 following a fall at the SNF. He had suture placed in his forehead for this. He also had a head CT that was negative for acute findings and labs that were at baseline. He was discharged from the SNF last Friday as his insurance would no longer cover this. He has been living with his daughter since last Friday. She reports that he has gone down hill quickly. He sleeps a majority of the day. He has a decreased appetite, although he will drink a couple chocolate ensures per day. She has been doing her best to keep him hydrated, but this is a struggle. Last night he asked to be taking to the ER as he didn't feel good. His HR was in the 150s when he arrived. He was also hypotensive. He had a CXR that showed mild pulmonary edema. A CT of the abdomen should moderate to large ascites in the abdomen and pelvis. He did receive 2 boluses slowly of 500 cc. His heart rate come down to around 100 and he felt much better after the fluids. He had a stable CBC, CMP, and liver function tests.   His daughter reports that he has mostly abstained from alcohol. He has had very small amounts but falls asleep quickly before he drinks more than a  few sips. He has not been able to sleep at night. His daughter also reports that he gets restless in the evening and then is up and down for most of the night. He denies orthopnea. He denies pain, nausea, vomiting, shortness of breath, or chest pain. He does has swelling in his abdomen, scrotum and lower legs that has been baseline for him.   He does not wish to continue to come to the doctor's office. Palliative care has been in touch and plans to come out next Friday for an assessment. He has signed a DNR.   He needs refills on medications today. He declines labs today.    Past Medical History:  Diagnosis Date   Anemia    Anxiety    Cataract    Cirrhosis of liver (HCC)    Dementia (HCC)    Hepatitis C    Hyperlipidemia    Liver lesion    Thrombocytopenia (HCC)    Vitamin D deficiency       ROS As per HPI.    Objective:     BP (!) 84/64   Pulse (!) 104   Temp 98.8 F (37.1 C)   Ht '5\' 7"'$  (1.702 m)   Wt 138 lb 3.2 oz (62.7 kg)   SpO2 92%   BMI 21.65 kg/m  BP Readings from Last 3 Encounters:  08/05/22 (!) 84/64  08/05/22 102/77  07/19/22 107/85      Physical Exam Vitals and nursing note reviewed.  Constitutional:      General: He is not in acute distress.    Appearance: He is not ill-appearing, toxic-appearing or diaphoretic.  HENT:     Nose: Nose normal.     Mouth/Throat:     Mouth: Mucous membranes are moist.     Pharynx: Oropharynx is clear.  Eyes:     General: Scleral icterus present.     Pupils: Pupils are equal, round, and reactive to light.  Cardiovascular:     Rate and Rhythm: Regular rhythm. Tachycardia present.     Heart sounds: Normal heart sounds. No murmur heard. Pulmonary:     Effort: Pulmonary effort is normal. No respiratory distress.     Breath sounds: Normal breath sounds. No wheezing.  Abdominal:     General: Bowel sounds are normal. There is distension.     Palpations: Abdomen is soft.     Tenderness: There is no abdominal  tenderness. There is no guarding or rebound.  Musculoskeletal:     Cervical back: No rigidity.     Right lower leg: 2+ Edema present.     Left lower leg: 2+ Edema present.  Skin:    General: Skin is warm and dry.     Coloration: Skin is jaundiced.  Neurological:     Mental Status: He is alert and oriented to person, place, and time. Mental status is at baseline.     Motor: Weakness (generalized) present.     Gait: Gait abnormal (arrives in wheelchair).  Psychiatric:        Mood and Affect: Mood normal.        Behavior: Behavior normal.      Results for orders placed or performed during the hospital encounter of 08/04/22  Lipase, blood  Result Value Ref Range   Lipase 65 (H) 11 - 51 U/L  Comprehensive metabolic panel  Result Value Ref Range   Sodium 132 (L) 135 - 145 mmol/L   Potassium 3.4 (L) 3.5 - 5.1 mmol/L   Chloride 102 98 - 111 mmol/L   CO2 21 (L) 22 - 32 mmol/L   Glucose, Bld 95 70 - 99 mg/dL   BUN 8 8 - 23 mg/dL   Creatinine, Ser 0.63 0.61 - 1.24 mg/dL   Calcium 7.7 (L) 8.9 - 10.3 mg/dL   Total Protein 7.9 6.5 - 8.1 g/dL   Albumin 2.4 (L) 3.5 - 5.0 g/dL   AST 118 (H) 15 - 41 U/L   ALT 50 (H) 0 - 44 U/L   Alkaline Phosphatase 76 38 - 126 U/L   Total Bilirubin 5.0 (H) 0.3 - 1.2 mg/dL   GFR, Estimated >60 >60 mL/min   Anion gap 9 5 - 15  CBC with Differential/Platelet  Result Value Ref Range   WBC 4.2 4.0 - 10.5 K/uL   RBC 3.76 (L) 4.22 - 5.81 MIL/uL   Hemoglobin 13.4 13.0 - 17.0 g/dL   HCT 38.9 (L) 39.0 - 52.0 %   MCV 103.5 (H) 80.0 - 100.0 fL   MCH 35.6 (H) 26.0 - 34.0 pg   MCHC 34.4 30.0 - 36.0 g/dL   RDW 14.9 11.5 - 15.5 %   Platelets 121 (L) 150 - 400 K/uL   nRBC 0.5 (H) 0.0 - 0.2 %   Neutrophils Relative % 49 %   Neutro Abs 2.1 1.7 - 7.7 K/uL   Lymphocytes Relative  37 %   Lymphs Abs 1.5 0.7 - 4.0 K/uL   Monocytes Relative 10 %   Monocytes Absolute 0.4 0.1 - 1.0 K/uL   Eosinophils Relative 3 %   Eosinophils Absolute 0.1 0.0 - 0.5 K/uL   Basophils  Relative 1 %   Basophils Absolute 0.1 0.0 - 0.1 K/uL   WBC Morphology MORPHOLOGY UNREMARKABLE    RBC Morphology MORPHOLOGY UNREMARKABLE    Smear Review Normal platelet morphology    Immature Granulocytes 0 %   Abs Immature Granulocytes 0.01 0.00 - 0.07 K/uL  Urinalysis, Routine w reflex microscopic Urine, Clean Catch  Result Value Ref Range   Color, Urine AMBER (A) YELLOW   APPearance CLEAR CLEAR   Specific Gravity, Urine >1.046 (H) 1.005 - 1.030   pH 7.0 5.0 - 8.0   Glucose, UA NEGATIVE NEGATIVE mg/dL   Hgb urine dipstick NEGATIVE NEGATIVE   Bilirubin Urine NEGATIVE NEGATIVE   Ketones, ur NEGATIVE NEGATIVE mg/dL   Protein, ur NEGATIVE NEGATIVE mg/dL   Nitrite NEGATIVE NEGATIVE   Leukocytes,Ua TRACE (A) NEGATIVE   RBC / HPF 0-5 0 - 5 RBC/hpf   WBC, UA 0-5 0 - 5 WBC/hpf   Bacteria, UA NONE SEEN NONE SEEN   Squamous Epithelial / LPF 0-5 0 - 5   Mucus PRESENT   Ammonia  Result Value Ref Range   Ammonia 45 (H) 9 - 35 umol/L    Last CBC Lab Results  Component Value Date   WBC 4.2 08/04/2022   HGB 13.4 08/04/2022   HCT 38.9 (L) 08/04/2022   MCV 103.5 (H) 08/04/2022   MCH 35.6 (H) 08/04/2022   RDW 14.9 08/04/2022   PLT 121 (L) 91/63/8466   Last metabolic panel Lab Results  Component Value Date   GLUCOSE 95 08/04/2022   NA 132 (L) 08/04/2022   K 3.4 (L) 08/04/2022   CL 102 08/04/2022   CO2 21 (L) 08/04/2022   BUN 8 08/04/2022   CREATININE 0.63 08/04/2022   GFRNONAA >60 08/04/2022   CALCIUM 7.7 (L) 08/04/2022   PHOS 2.3 (L) 07/12/2022   PROT 7.9 08/04/2022   ALBUMIN 2.4 (L) 08/04/2022   LABGLOB 4.9 (H) 07/08/2022   AGRATIO 0.7 (L) 07/08/2022   BILITOT 5.0 (H) 08/04/2022   ALKPHOS 76 08/04/2022   AST 118 (H) 08/04/2022   ALT 50 (H) 08/04/2022   ANIONGAP 9 08/04/2022      The ASCVD Risk score (Arnett DK, et al., 2019) failed to calculate for the following reasons:   The valid systolic blood pressure range is 90 to 200 mmHg    Assessment & Plan:   Kyle Hammond was  seen today for hospitalization follow-up.  Diagnoses and all orders for this visit:  Chronic liver failure without hepatic coma ([place]) Alcoholic cirrhosis of liver without ascites ([place]) Chronic hepatitis C without hepatic coma [facility]) Reviewed hospital noted from admission and ER visit on 07/19/22 and 08/04/22, along with labs, EKGs and imaging. His labs have been stable for his baseline. He denies pain, nausea, vomiting, or fever. Progressing quickly. Discussed prognosis of less than 6 months, likely weeks. Urgent referral to hospice placed today. He does not wish to follow up in the office anymore.  -     folic acid (FOLVITE) 1 MG tablet; Take 1 tablet (1 mg total) by mouth daily. -     Magnesium Oxide 400 MG CAPS; Take 1 capsule (400 mg total) by mouth daily. -     sodium chloride 1 g tablet; Take  1 tablet (1 g total) by mouth daily. -     thiamine (VITAMIN B-1) 100 MG tablet; Take 1 tablet (100 mg total) by mouth daily. -     Ambulatory referral to Hospice  Severe congestive heart failure [facility]) Palliative care recommended per cardiology. Refill of lasix today. CXR in ER yesterday with mild pulmonary edema. Discussed unable to increase lasix dosage due to hypotension. Discussed low salt diet. Labs CBC and CMP yesterday. -     furosemide (LASIX) 20 MG tablet; Take 1 tablet (20 mg total) by mouth daily. -     Ambulatory referral to Hospice  Hypotension due to hypovolemia Discussed need for hydration. BP is low today and mildy tachycardic. His daughter will try liquid IV supplement.   Moderate protein-calorie malnutrition ([place]) Continue ensure.   Insomnia due to medical condition Will try a low dose of trazodone.  -     traZODone (DESYREL) 50 MG tablet; Take 0.5 tablets (25 mg total) by mouth at bedtime as needed for sleep.  Return to office for new or worsening symptoms, or if symptoms persist. He does not wish to continue to come to the office for follow up. I have placed a urgent referral  to hospice as disease is progressing rapidly.   The patient and family indicate understanding of these issues and agrees with the plan.   Gwenlyn Perking, FNP

## 2022-08-06 DIAGNOSIS — I5023 Acute on chronic systolic (congestive) heart failure: Secondary | ICD-10-CM | POA: Diagnosis not present

## 2022-08-06 DIAGNOSIS — Z681 Body mass index (BMI) 19 or less, adult: Secondary | ICD-10-CM | POA: Diagnosis not present

## 2022-08-06 DIAGNOSIS — S63289A Dislocation of proximal interphalangeal joint of unspecified finger, initial encounter: Secondary | ICD-10-CM | POA: Diagnosis not present

## 2022-08-06 DIAGNOSIS — I700 Atherosclerosis of aorta: Secondary | ICD-10-CM | POA: Diagnosis not present

## 2022-08-06 DIAGNOSIS — S63251D Unspecified dislocation of left index finger, subsequent encounter: Secondary | ICD-10-CM | POA: Diagnosis not present

## 2022-08-06 DIAGNOSIS — I5084 End stage heart failure: Secondary | ICD-10-CM | POA: Diagnosis not present

## 2022-08-06 DIAGNOSIS — I110 Hypertensive heart disease with heart failure: Secondary | ICD-10-CM | POA: Diagnosis not present

## 2022-08-06 DIAGNOSIS — R54 Age-related physical debility: Secondary | ICD-10-CM | POA: Diagnosis not present

## 2022-08-06 DIAGNOSIS — F17210 Nicotine dependence, cigarettes, uncomplicated: Secondary | ICD-10-CM | POA: Diagnosis not present

## 2022-08-06 DIAGNOSIS — Z66 Do not resuscitate: Secondary | ICD-10-CM | POA: Diagnosis not present

## 2022-08-06 DIAGNOSIS — I5030 Unspecified diastolic (congestive) heart failure: Secondary | ICD-10-CM | POA: Diagnosis not present

## 2022-08-06 DIAGNOSIS — R9082 White matter disease, unspecified: Secondary | ICD-10-CM | POA: Diagnosis not present

## 2022-08-06 DIAGNOSIS — M79642 Pain in left hand: Secondary | ICD-10-CM | POA: Diagnosis not present

## 2022-08-06 DIAGNOSIS — R918 Other nonspecific abnormal finding of lung field: Secondary | ICD-10-CM | POA: Diagnosis not present

## 2022-08-06 DIAGNOSIS — R0602 Shortness of breath: Secondary | ICD-10-CM | POA: Diagnosis not present

## 2022-08-06 DIAGNOSIS — K7210 Chronic hepatic failure without coma: Secondary | ICD-10-CM | POA: Diagnosis not present

## 2022-08-06 DIAGNOSIS — S3991XA Unspecified injury of abdomen, initial encounter: Secondary | ICD-10-CM | POA: Diagnosis not present

## 2022-08-06 DIAGNOSIS — J90 Pleural effusion, not elsewhere classified: Secondary | ICD-10-CM | POA: Diagnosis not present

## 2022-08-06 DIAGNOSIS — I509 Heart failure, unspecified: Secondary | ICD-10-CM | POA: Diagnosis not present

## 2022-08-06 DIAGNOSIS — S199XXA Unspecified injury of neck, initial encounter: Secondary | ICD-10-CM | POA: Diagnosis not present

## 2022-08-06 DIAGNOSIS — K4020 Bilateral inguinal hernia, without obstruction or gangrene, not specified as recurrent: Secondary | ICD-10-CM | POA: Diagnosis not present

## 2022-08-06 DIAGNOSIS — S63285A Dislocation of proximal interphalangeal joint of left ring finger, initial encounter: Secondary | ICD-10-CM | POA: Diagnosis not present

## 2022-08-06 DIAGNOSIS — S299XXA Unspecified injury of thorax, initial encounter: Secondary | ICD-10-CM | POA: Diagnosis not present

## 2022-08-06 DIAGNOSIS — S0993XA Unspecified injury of face, initial encounter: Secondary | ICD-10-CM | POA: Diagnosis not present

## 2022-08-06 DIAGNOSIS — W109XXA Fall (on) (from) unspecified stairs and steps, initial encounter: Secondary | ICD-10-CM | POA: Diagnosis not present

## 2022-08-06 DIAGNOSIS — S63285D Dislocation of proximal interphalangeal joint of left ring finger, subsequent encounter: Secondary | ICD-10-CM | POA: Diagnosis not present

## 2022-08-06 DIAGNOSIS — Z515 Encounter for palliative care: Secondary | ICD-10-CM | POA: Diagnosis not present

## 2022-08-06 DIAGNOSIS — E43 Unspecified severe protein-calorie malnutrition: Secondary | ICD-10-CM | POA: Diagnosis not present

## 2022-08-06 DIAGNOSIS — W19XXXA Unspecified fall, initial encounter: Secondary | ICD-10-CM | POA: Diagnosis not present

## 2022-08-06 DIAGNOSIS — S60419A Abrasion of unspecified finger, initial encounter: Secondary | ICD-10-CM | POA: Diagnosis not present

## 2022-08-06 DIAGNOSIS — J811 Chronic pulmonary edema: Secondary | ICD-10-CM | POA: Diagnosis not present

## 2022-08-06 DIAGNOSIS — Z0389 Encounter for observation for other suspected diseases and conditions ruled out: Secondary | ICD-10-CM | POA: Diagnosis not present

## 2022-08-06 DIAGNOSIS — R7989 Other specified abnormal findings of blood chemistry: Secondary | ICD-10-CM | POA: Diagnosis not present

## 2022-08-06 DIAGNOSIS — D3501 Benign neoplasm of right adrenal gland: Secondary | ICD-10-CM | POA: Diagnosis not present

## 2022-08-06 DIAGNOSIS — R7401 Elevation of levels of liver transaminase levels: Secondary | ICD-10-CM | POA: Diagnosis not present

## 2022-08-06 DIAGNOSIS — K7460 Unspecified cirrhosis of liver: Secondary | ICD-10-CM | POA: Diagnosis not present

## 2022-08-06 DIAGNOSIS — G4700 Insomnia, unspecified: Secondary | ICD-10-CM | POA: Diagnosis not present

## 2022-08-06 DIAGNOSIS — M2578 Osteophyte, vertebrae: Secondary | ICD-10-CM | POA: Diagnosis not present

## 2022-08-06 DIAGNOSIS — S0083XA Contusion of other part of head, initial encounter: Secondary | ICD-10-CM | POA: Diagnosis not present

## 2022-08-06 DIAGNOSIS — I5020 Unspecified systolic (congestive) heart failure: Secondary | ICD-10-CM | POA: Diagnosis not present

## 2022-08-06 DIAGNOSIS — S63257D Unspecified dislocation of left little finger, subsequent encounter: Secondary | ICD-10-CM | POA: Diagnosis not present

## 2022-08-06 DIAGNOSIS — R627 Adult failure to thrive: Secondary | ICD-10-CM | POA: Diagnosis not present

## 2022-08-06 DIAGNOSIS — R000 Tachycardia, unspecified: Secondary | ICD-10-CM | POA: Diagnosis not present

## 2022-08-06 DIAGNOSIS — I2510 Atherosclerotic heart disease of native coronary artery without angina pectoris: Secondary | ICD-10-CM | POA: Diagnosis not present

## 2022-08-06 DIAGNOSIS — Z79899 Other long term (current) drug therapy: Secondary | ICD-10-CM | POA: Diagnosis not present

## 2022-08-06 DIAGNOSIS — W19XXXD Unspecified fall, subsequent encounter: Secondary | ICD-10-CM | POA: Diagnosis not present

## 2022-08-06 DIAGNOSIS — G319 Degenerative disease of nervous system, unspecified: Secondary | ICD-10-CM | POA: Diagnosis not present

## 2022-08-06 DIAGNOSIS — K7290 Hepatic failure, unspecified without coma: Secondary | ICD-10-CM | POA: Diagnosis not present

## 2022-08-06 DIAGNOSIS — S63287D Dislocation of proximal interphalangeal joint of left little finger, subsequent encounter: Secondary | ICD-10-CM | POA: Diagnosis not present

## 2022-08-06 DIAGNOSIS — N200 Calculus of kidney: Secondary | ICD-10-CM | POA: Diagnosis not present

## 2022-08-09 DIAGNOSIS — S63285A Dislocation of proximal interphalangeal joint of left ring finger, initial encounter: Secondary | ICD-10-CM | POA: Diagnosis not present

## 2022-08-10 ENCOUNTER — Telehealth: Payer: Self-pay | Admitting: Family Medicine

## 2022-08-10 NOTE — Telephone Encounter (Signed)
Pt called requesting to speak with PCPs nurse ASAP.

## 2022-08-10 NOTE — Telephone Encounter (Signed)
Lmtcb.

## 2022-08-11 DIAGNOSIS — J811 Chronic pulmonary edema: Secondary | ICD-10-CM | POA: Diagnosis not present

## 2022-08-11 DIAGNOSIS — J90 Pleural effusion, not elsewhere classified: Secondary | ICD-10-CM | POA: Diagnosis not present

## 2022-08-11 DIAGNOSIS — R0602 Shortness of breath: Secondary | ICD-10-CM | POA: Diagnosis not present

## 2022-08-12 NOTE — Telephone Encounter (Signed)
Daughter rc for nurse. She is asking about hospice care. Please call back

## 2022-08-12 NOTE — Telephone Encounter (Signed)
I called and spoke with pt's daughter as well as Marjorie Smolder was in on the call and advised pt's daughter to call [facility] as he has been accepted through them and pt's daughter voiced understanding.

## 2022-08-14 ENCOUNTER — Ambulatory Visit: Payer: Medicare Other | Admitting: Family Medicine

## 2022-08-17 ENCOUNTER — Encounter: Payer: Self-pay | Admitting: Family Medicine

## 2022-08-17 ENCOUNTER — Ambulatory Visit: Payer: Medicare Other | Admitting: Family Medicine

## 2022-08-21 DIAGNOSIS — R000 Tachycardia, unspecified: Secondary | ICD-10-CM | POA: Diagnosis not present

## 2022-09-14 DEATH — deceased

## 2022-09-18 ENCOUNTER — Ambulatory Visit: Payer: Medicare Other

## 2024-04-04 NOTE — Progress Notes (Signed)
 Erroneous encounter
